# Patient Record
Sex: Female | Born: 1990
Health system: Southern US, Community
[De-identification: ages and names within clinical notes are randomized; demographics above are authoritative.]

## PROBLEM LIST (undated history)

## (undated) DIAGNOSIS — F419 Anxiety disorder, unspecified: Secondary | ICD-10-CM

---

## 2017-09-11 ENCOUNTER — Ambulatory Visit (INDEPENDENT_AMBULATORY_CARE_PROVIDER_SITE_OTHER): Payer: Self-pay

## 2017-09-11 ENCOUNTER — Encounter: Payer: Self-pay | Admitting: General Practice

## 2017-09-11 DIAGNOSIS — Z3201 Encounter for pregnancy test, result positive: Secondary | ICD-10-CM

## 2017-09-11 NOTE — Progress Notes (Signed)
Pt came for UPT-positive, took 2 pregnancy test at home both positive.Based on LMP of 08/20/17, current EDD: 05/27/18, GA: [id number].But pt feels that she's further along than that, states wants care here & needed Letter showing proof of pregnancy.Ad[facility] & also schedule Initial appt. Pt verbally understood.

## 2017-09-11 NOTE — Progress Notes (Signed)
Chart reviewed for nurse visit. Agree with plan of care.   Pincus Largehelps, Jazma Y, DO 09/11/2017 4:48 PM

## 2017-09-12 LAB — POCT PREGNANCY, URINE: Preg Test, Ur: POSITIVE — AB

## 2018-01-14 ENCOUNTER — Encounter (HOSPITAL_COMMUNITY): Payer: Self-pay | Admitting: Emergency Medicine

## 2018-01-14 ENCOUNTER — Observation Stay (HOSPITAL_COMMUNITY)
Admission: EM | Admit: 2018-01-14 | Discharge: 2018-01-14 | Disposition: A | Discharge status: Home / Self Care | Payer: Self-pay | Attending: Cardiovascular Disease | Admitting: Cardiovascular Disease

## 2018-01-14 ENCOUNTER — Other Ambulatory Visit: Payer: Self-pay

## 2018-01-14 DIAGNOSIS — F410 Panic disorder [episodic paroxysmal anxiety] without agoraphobia: Secondary | ICD-10-CM | POA: Insufficient documentation

## 2018-01-14 DIAGNOSIS — R001 Bradycardia, unspecified: Secondary | ICD-10-CM | POA: Insufficient documentation

## 2018-01-14 DIAGNOSIS — R9431 Abnormal electrocardiogram [ECG] [EKG]: Secondary | ICD-10-CM

## 2018-01-14 DIAGNOSIS — R55 Syncope and collapse: Principal | ICD-10-CM | POA: Diagnosis present

## 2018-01-14 DIAGNOSIS — F1290 Cannabis use, unspecified, uncomplicated: Secondary | ICD-10-CM | POA: Insufficient documentation

## 2018-01-14 DIAGNOSIS — F17210 Nicotine dependence, cigarettes, uncomplicated: Secondary | ICD-10-CM | POA: Insufficient documentation

## 2018-01-14 DIAGNOSIS — I4510 Unspecified right bundle-branch block: Secondary | ICD-10-CM | POA: Insufficient documentation

## 2018-01-14 HISTORY — DX: Anxiety disorder, unspecified: F41.9

## 2018-01-14 NOTE — ED Notes (Signed)
Pt leaving AMA, spoke with EDP and decided to leave anyway, risks reviewed, pt verbalized understanding, pt discharged ambulatory with female companion.

## 2018-01-14 NOTE — H&P (Signed)
Cardiology Admission History and Physical:   Patient ID: Sylvia Pennington; MRN: [id number]; DOB: 15-Oct-1990   Admission date: 01/14/2018  Primary Care Provider: Patient, No Pcp Per Primary Cardiologist:  Nahser  Primary Electrophysiologist:  New   Chief Complaint:   Syncope   Patient Profile:   Sylvia Pennington is a 27 y.o. female with a history of recurrent syncope.   History of Present Illness:   Sylvia Pennington is a 27 year old female with a history of recurrent syncope.  These episodes of syncope started for 5 years ago when she would have them once or twice a year.  For the past 2 years they have been occurring much more frequent and now she has them every month or so. He has no memory of happens before or after the event.  She does not know how long she is out.  She denies any chest pain or shortness of breath.  She does not exercise.  She is not all that active.  She does not work.  She has a positive family history of early cardiac death.  She had a cousin who died suddenly at his workplace in his 21s.   Past Medical History:  Diagnosis Date  . Anxiety     History reviewed. No pertinent surgical history.   Medications Prior to Admission: Prior to Admission medications   Not on File     Allergies:   No Known Allergies  Social History:   Social History   Socioeconomic History  . Marital status: Married    Spouse name: Not on file  . Number of children: Not on file  . Years of education: Not on file  . Highest education level: Not on file  Occupational History  . Not on file  Social Needs  . Financial resource strain: Not on file  . Food insecurity:    Worry: Not on file    Inability: Not on file  . Transportation needs:    Medical: Not on file    Non-medical: Not on file  Tobacco Use  . Smoking status: Current Every Day Smoker  Substance and Sexual Activity  . Alcohol use: Not Currently  . Drug use: Yes    Types: Marijuana  . Sexual activity: Not on file    Lifestyle  . Physical activity:    Days per week: Not on file    Minutes per session: Not on file  . Stress: Not on file  Relationships  . Social connections:    Talks on phone: Not on file    Gets together: Not on file    Attends religious service: Not on file    Active member of club or organization: Not on file    Attends meetings of clubs or organizations: Not on file    Relationship status: Not on file  . Intimate partner violence:    Fear of current or ex partner: Not on file    Emotionally abused: Not on file    Physically abused: Not on file    Forced sexual activity: Not on file  Other Topics Concern  . Not on file  Social History Narrative  . Not on file    Family History:   The patient's family history includes Sudden Cardiac Death in her cousin.    ROS:  Please see the history of present illness.  All other ROS reviewed and negative.     Physical Exam/Data:   Vitals:   01/14/18 1403 01/14/18 1411  BP: 96/61   Pulse:  62   Resp: 18   Temp: 97.7 F (36.5 C)   TempSrc: Oral   SpO2: 100%   Weight:  120 lb (54.4 kg)  Height:  5\' 5"  (1.651 m)   No intake or output data in the 24 hours ending 01/14/18 1752 Filed Weights   01/14/18 1411  Weight: 120 lb (54.4 kg)   Body mass index is 19.97 kg/m.  General:   Young black female, no acute distress.  She is very somnolent.  Very thin. HEENT: normal Lymph: no adenopathy Neck: No JVD.  Normal carotids. Endocrine:  No thryomegaly Vascular: No carotid bruits; FA pulses 2+ bilaterally without bruits  Cardiac:  normal S1, S2; RRR; no murmur  Lungs:  clear to auscultation bilaterally, no wheezing, rhonchi or rales  Abd: soft, nontender, no hepatomegaly  Ext: no edema Musculoskeletal:  No deformities, BUE and BLE strength normal and equal Skin: warm and dry  Neuro:  CNs 2-12 intact, no focal abnormalities noted Psych:  Normal affect    EKG: EKG on August 5 reveals sinus bradycardia at 57 beats a minute.  She  has incomplete right bundle branch block concerning for a type II Brugada syndrome.  Relevant CV Studies:   Laboratory Data:  ChemistryNo results for input(s): NA, K, CL, CO2, GLUCOSE, BUN, CREATININE, CALCIUM, GFRNONAA, GFRAA, ANIONGAP in the last 168 hours.  No results for input(s): PROT, ALBUMIN, AST, ALT, ALKPHOS, BILITOT in the last 168 hours. HematologyNo results for input(s): WBC, RBC, HGB, HCT, MCV, MCH, MCHC, RDW, PLT in the last 168 hours. Cardiac EnzymesNo results for input(s): TROPONINI in the last 168 hours. No results for input(s): TROPIPOC in the last 168 hours.  BNPNo results for input(s): BNP, PROBNP in the last 168 hours.  DDimer No results for input(s): DDIMER in the last 168 hours.  Radiology/Studies:  No results found.  Assessment and Plan:   1. Recurrent syncope: The patient has had episodes of syncope for years and they recently seem to be worsening slightly.  Her EKG shows right bundle branch block configuration and possibly a type II Brugada syndrome.  In addition, she has a first cousin who died of sudden cardiac death while at his workplace.  I discussed the case with Dr. Elberta Fortisamnitz of the letter physiology.  We will admit her for observation.  The plan is to have her see electrophysiology tomorrow and possibly do a flecainide challenge.  2.  Anxiety: She has a history of anxiety.  She will need to follow-up with her general medical doctor.  Severity of Illness: The appropriate patient status for this patient is OBSERVATION. Observation status is judged to be reasonable and necessary in order to provide the required intensity of service to ensure the patient's safety. The patient's presenting symptoms, physical exam findings, and initial radiographic and laboratory data in the context of their medical condition is felt to place them at decreased risk for further clinical deterioration. Furthermore, it is anticipated that the patient will be medically stable for  discharge from the hospital within 2 midnights of admission. The following factors support the patient status of observation.   " The patient's presenting symptoms include  Syncope . " The physical exam findings include  None . " The initial radiographic and laboratory data are  Abn ECG c/w Brugada syndrome .     For questions or updates, please contact [facility] Please consult [email or web address] for contact info under Cardiology/STEMI.    Signed, Kristeen MissPhilip Nahser, MD  01/14/2018 5:52 PM

## 2018-01-14 NOTE — ED Notes (Signed)
Took patient vitals patient is resting in the hall way

## 2018-01-14 NOTE — ED Provider Notes (Signed)
[facility] Emergency Department Provider Note MRN:  [id number]  Arrival date & time: 01/14/18     Chief Complaint   Panic Attack   History of Present Illness   Sylvia Pennington is a 27 y.o. year-old female with a history of anxiety and panic attacks presenting to the ED with chief complaint of panic attack.  Patient explains that to 3 hours prior to arrival she was very anxious and stressed out about her life, did not want to give details.  She explains that she passed out briefly quickly regained consciousness.  Denies ever having chest pain prior to or after the event.  No recent fevers or cough.  States this is similar to prior panic attacks.  Currently asymptomatic.  Denies SI, HI, AVH.  Some occasional alcohol and marijuana use.  Review of Systems  A complete 10 system review of systems was obtained and all systems are negative except as noted in the HPI and PMH.   Patient's Health History    Past Medical History:  Diagnosis Date  . Anxiety     History reviewed. No pertinent surgical history.  Family History  Problem Relation Age of Onset  . Sudden Cardiac Death Cousin     Social History   Socioeconomic History  . Marital status: Married    Spouse name: Not on file  . Number of children: Not on file  . Years of education: Not on file  . Highest education level: Not on file  Occupational History  . Not on file  Social Needs  . Financial resource strain: Not on file  . Food insecurity:    Worry: Not on file    Inability: Not on file  . Transportation needs:    Medical: Not on file    Non-medical: Not on file  Tobacco Use  . Smoking status: Current Every Day Smoker  Substance and Sexual Activity  . Alcohol use: Not Currently  . Drug use: Yes    Types: Marijuana  . Sexual activity: Not on file  Lifestyle  . Physical activity:    Days per week: Not on file    Minutes per session: Not on file  . Stress: Not on file  Relationships  . Social  connections:    Talks on phone: Not on file    Gets together: Not on file    Attends religious service: Not on file    Active member of club or organization: Not on file    Attends meetings of clubs or organizations: Not on file    Relationship status: Not on file  . Intimate partner violence:    Fear of current or ex partner: Not on file    Emotionally abused: Not on file    Physically abused: Not on file    Forced sexual activity: Not on file  Other Topics Concern  . Not on file  Social History Narrative  . Not on file     Physical Exam  Vital Signs and Nursing Notes reviewed Vitals:   01/14/18 1403  BP: 96/61  Pulse: 62  Resp: 18  Temp: 97.7 F (36.5 C)  SpO2: 100%    CONSTITUTIONAL: Well-appearing, NAD NEURO:  Alert and oriented x 3, no focal deficits EYES:  eyes equal and reactive ENT/NECK:  no LAD, no JVD CARDIO: Regular rate, well-perfused, normal S1 and S2 PULM:  CTAB no wheezing or rhonchi GI/GU:  normal bowel sounds, non-distended, non-tender MSK/SPINE:  No gross deformities, no edema SKIN:  no rash, atraumatic PSYCH:  Appropriate speech and behavior  Diagnostic and Interventional Summary    EKG Interpretation  Date/Time:    Ventricular Rate:    PR Interval:    QRS Duration:   QT Interval:    QTC Calculation:   R Axis:     Text Interpretation:        Labs Reviewed - No data to display  No orders to display    Medications - No data to display   Procedures Critical Care  ED Course and Medical Decision Making  I have reviewed the triage vital signs and the nursing notes.  Pertinent labs & imaging results that were available during my care of the patient were reviewed by me and considered in my medical decision making (see below for details). Clinical Course as of Jan 14 2234  Mon Jan 14, 2018  6575 27 year old female presenting with symptoms consistent with her prior panic attacks.  Denying any chest pain, vital signs stable, currently back  to baseline, sleeping comfortably, symptoms of anxiety resolved.  No specific triggers of stress recently, not a harm to herself or others.  Will obtain screening EKG and discharge.   [MB]  1639 Screening EKG with incomplete right bundle branch block, will consult cardiology to discuss possibility of Brugada.   [MB]  1802 EKG discussed with cardiologist as well as electrophysiologist, patient will be admitted for further evaluation.   [MB]    Clinical Course User Index [MB] Sabas Sous, MD    While waiting for an inpatient bed, patient became impatient with the weight, requesting to leave AGAINST MEDICAL ADVICE.  Had a long discussion with the patient, also with family member present, about the risks of leaving with a possible abnormality on her EKG and heart.  Discussed the possibility of recurrent symptoms of syncope, arrhythmia, death.  Patient expresses understanding of these risks, still wishing to leave.  States that she will come back tomorrow.  Family unable to persuade her to stay.  Advised patient to return to the emergency department at any time for continued care.  Elmer Sow. Pilar Plate, MD [facility] Emergency Medicine [facility] [email or web address]  Final Clinical Impressions(s) / ED Diagnoses     ICD-10-CM   1. Abnormal EKG R94.31   2. Syncope, unspecified syncope type R55     ED Discharge Orders    None         Sabas Sous, MD 01/14/18 2235

## 2018-01-14 NOTE — ED Triage Notes (Signed)
Arrived via EMS from home patient felt like she could stand up. EMS reported patient feels overwhelmed and recent history of anxiety. States used a small amount of mariajuana today. Alert answering and following commands appropriate.

## 2018-01-14 NOTE — ED Notes (Signed)
Pt wishing to leave AMA, MD aware.

## 2019-06-13 NOTE — L&D Delivery Note (Addendum)
Delivery Note Sylvia Pennington is a 29 y.o. G2P0010 at [redacted]w[redacted]d admitted for SOL.   GBS Status:  Positive/-- (11/13 0000) Maximum Maternal Temperature: 98.8  Labor course: Initial SVE: 7. Augmentation with: Pitocin. She then progressed to complete.  ROM: 12h 82m with light meconium stained fluid  Birth: At 1551 a viable female was delivered via spontaneous vaginal delivery (Presentation: cephalic; LOA). Nuchal cord present: No.  Cord around body. There was difficulty with delivery of the shoulders in which a shoulder dystocia was called. Dr. Vergie Living was called by RN staff to attend delivery. McRoberts with suprapubic pressure, followed by woodscrew was attempted. After 30 seconds, the posterior shoulder was delivered without difficulty. Cord clamped x2 and cut by CNM after 30 seconds and taken to warmer where NICU was present to care for infant. Cord blood collected. The placenta separated spontaneously and delivered via gentle cord traction.  Pitocin infused rapidly IV per protocol. Brisk bleeding continued and TXA and Cytotec were given. Foley catheter was placed to empty bladder. Fundus firm with massage.  Placenta inspected and appears to be intact with a 3 VC.   Sponge and instrument count were correct x2.  Intrapartum complications: pre-eclampsia, shoulder dystocia Anesthesia:  local and epidural Episiotomy: none Lacerations:  1st degree labial- hemostatic and not repaired, 2nd degree vaginal- repaired in usual fashion Suture Repair: 3.0 vicryl EBL (mL): 1016   Infant: APGAR (1 MIN): 5   APGAR (5 MINS): 7   APGAR (10 MINS): 9    Infant weight: pending  Mom to [facility].  Baby to Couplet care / Skin to Skin. Placenta to Pathology for MSAF   Plans to Bottlefeed Contraception: none Circumcision: declines  Note sent to [facility]: [facility] for pp visit.  Brand Males CNM 04/24/2020 4:41 PM

## 2019-12-04 DIAGNOSIS — H1013 Acute atopic conjunctivitis, bilateral: Secondary | ICD-10-CM | POA: Diagnosis not present

## 2019-12-09 ENCOUNTER — Other Ambulatory Visit: Payer: Self-pay

## 2019-12-09 ENCOUNTER — Inpatient Hospital Stay (HOSPITAL_COMMUNITY)
Admission: AD | Admit: 2019-12-09 | Discharge: 2019-12-09 | Disposition: A | Discharge status: Home / Self Care | Payer: Medicaid Other | Attending: Obstetrics and Gynecology | Admitting: Obstetrics and Gynecology

## 2019-12-09 ENCOUNTER — Encounter (HOSPITAL_COMMUNITY): Payer: Self-pay | Admitting: Obstetrics and Gynecology

## 2019-12-09 DIAGNOSIS — R109 Unspecified abdominal pain: Secondary | ICD-10-CM | POA: Insufficient documentation

## 2019-12-09 DIAGNOSIS — O26892 Other specified pregnancy related conditions, second trimester: Secondary | ICD-10-CM | POA: Insufficient documentation

## 2019-12-09 DIAGNOSIS — O99612 Diseases of the digestive system complicating pregnancy, second trimester: Secondary | ICD-10-CM | POA: Diagnosis not present

## 2019-12-09 DIAGNOSIS — Z3A21 21 weeks gestation of pregnancy: Secondary | ICD-10-CM | POA: Insufficient documentation

## 2019-12-09 DIAGNOSIS — F17210 Nicotine dependence, cigarettes, uncomplicated: Secondary | ICD-10-CM | POA: Insufficient documentation

## 2019-12-09 DIAGNOSIS — R1031 Right lower quadrant pain: Secondary | ICD-10-CM | POA: Diagnosis not present

## 2019-12-09 DIAGNOSIS — O99332 Smoking (tobacco) complicating pregnancy, second trimester: Secondary | ICD-10-CM | POA: Insufficient documentation

## 2019-12-09 DIAGNOSIS — R102 Pelvic and perineal pain: Secondary | ICD-10-CM | POA: Diagnosis present

## 2019-12-09 DIAGNOSIS — K5900 Constipation, unspecified: Secondary | ICD-10-CM | POA: Diagnosis not present

## 2019-12-09 DIAGNOSIS — O0932 Supervision of pregnancy with insufficient antenatal care, second trimester: Secondary | ICD-10-CM

## 2019-12-09 LAB — URINALYSIS, ROUTINE W REFLEX MICROSCOPIC
Bilirubin Urine: NEGATIVE
Glucose, UA: NEGATIVE mg/dL
Ketones, ur: NEGATIVE mg/dL
Leukocytes,Ua: NEGATIVE
Nitrite: NEGATIVE
Protein, ur: NEGATIVE mg/dL
Specific Gravity, Urine: 1.019 (ref 1.005–1.030)
pH: 5 (ref 5.0–8.0)

## 2019-12-09 LAB — ABO/RH: ABO/RH(D): A POS

## 2019-12-09 LAB — POCT PREGNANCY, URINE: Preg Test, Ur: POSITIVE — AB

## 2019-12-09 NOTE — Discharge Instructions (Signed)

## 2019-12-09 NOTE — MAU Note (Signed)
Presents for RLQ pain, that occurred this morning, but not currently.  Denies VB.  Reports LMP was first week of January.  States needs proof of pregnancy and a ultrasound.  Hasn't initiated [facility].

## 2019-12-09 NOTE — MAU Provider Note (Signed)
History     CSN: [id number]  Arrival date and time: 12/09/19 1346   First Provider Initiated Contact with Patient 12/09/19 1448      Chief Complaint  Patient presents with  . Pelvic Pain   HPI  [name] is a 29 y.o. G2P0010 at [redacted]w[redacted]d who presents to MAU today with complaint of pain this morning in the RLQ. She denies pain now. She did not take any pain medications. She denies vaginal bleeding, discharge, UTI symptoms, N/V/D today. She has had mild constipation, last BM was yesterday. She states LMP was in January.   OB History    Gravida  2   Para      Term      Preterm      AB  1   Living        SAB  1   TAB      Ectopic      Multiple      Live Births              Past Medical History:  Diagnosis Date  . Anxiety     History reviewed. No pertinent surgical history.  Family History  Problem Relation Age of Onset  . Sudden Cardiac Death Cousin     Social History   Tobacco Use  . Smoking status: Current Every Day Smoker    Packs/day: 0.25    Years: 0.00    Pack years: 0.00  . Smokeless tobacco: Never Used  . Tobacco comment: 1-2 cig/day  Substance Use Topics  . Alcohol use: Not Currently  . Drug use: Not Currently    Types: Marijuana    Comment: states it has been a month    Allergies: No Known Allergies  No medications prior to admission.    Review of Systems  Constitutional: Negative for fever.  Gastrointestinal: Positive for abdominal pain and constipation. Negative for diarrhea, nausea and vomiting.  Genitourinary: Negative for dysuria, frequency, urgency, vaginal bleeding and vaginal discharge.   Physical Exam   Blood pressure 117/71, pulse 81, temperature 98.2 F (36.8 C), temperature source Oral, resp. rate 20, height 5\' 5"  (1.651 m), weight 68.2 kg, last menstrual period 07/12/2019, SpO2 99 %.  Physical Exam Vitals and nursing note reviewed.  Constitutional:      General: She is not in acute distress.     Appearance: She is well-developed.  HENT:     Head: Normocephalic and atraumatic.  Cardiovascular:     Rate and Rhythm: Normal rate.  Pulmonary:     Effort: Pulmonary effort is normal.  Abdominal:     General: There is no distension.     Palpations: Abdomen is soft. There is no mass.     Tenderness: There is no abdominal tenderness. There is no guarding or rebound.     Comments: FH 22 cm  Skin:    General: Skin is warm and dry.     Findings: No erythema.  Neurological:     Mental Status: She is alert and oriented to person, place, and time.  Psychiatric:        Mood and Affect: Mood normal.   Cervix: closed, thick, firm, posterior  Results for orders placed or performed during the hospital encounter of 12/09/19 (from the past 24 hour(s))  Pregnancy, urine POC     Status: Abnormal   Collection Time: 12/09/19  2:25 PM  Result Value Ref Range   Preg Test, Ur POSITIVE (A) NEGATIVE  Urinalysis, Routine w  reflex microscopic     Status: Abnormal   Collection Time: 12/09/19  2:29 PM  Result Value Ref Range   Color, Urine YELLOW YELLOW   APPearance HAZY (A) CLEAR   Specific Gravity, Urine 1.019 1.005 - 1.030   pH 5.0 5.0 - 8.0   Glucose, UA NEGATIVE NEGATIVE mg/dL   Hgb urine dipstick SMALL (A) NEGATIVE   Bilirubin Urine NEGATIVE NEGATIVE   Ketones, ur NEGATIVE NEGATIVE mg/dL   Protein, ur NEGATIVE NEGATIVE mg/dL   Nitrite NEGATIVE NEGATIVE   Leukocytes,Ua NEGATIVE NEGATIVE   RBC / HPF 6-10 0 - 5 RBC/hpf   WBC, UA 0-5 0 - 5 WBC/hpf   Bacteria, UA RARE (A) NONE SEEN   Squamous Epithelial / LPF 6-10 0 - 5   Mucus PRESENT   ABO/Rh     Status: None   Collection Time: 12/09/19  3:05 PM  Result Value Ref Range   ABO/RH(D) A POS    No rh immune globuloin      NOT A RH IMMUNE GLOBULIN CANDIDATE, PT RH POSITIVE Performed at [facility], [address] [place], [place] [address]     MAU Course  Procedures None  MDM +UPT FHR - 138 bpm ABO/Rh today  Cervix  closed  Assessment and Plan  A: SIUP around 20-22 weeks  Intermittent abdominal pain  Constipation, mild   P: Discharge home Tylenol PRN for pain advised Consider Colace and/or Mirilax for intermittent constipation  Second trimester precautions discussed Outpatient MFM [place] for Anatomy. Message sent to MFM to schedule.  Patient advised to follow-up with [facility] to start prenatal care as planned  Patient may return to MAU as needed or if her condition were to change or worsen  Vonzella Nipple, PA-C 12/09/2019, 3:26 PM

## 2019-12-10 LAB — CULTURE, OB URINE: Culture: NO GROWTH

## 2019-12-13 DIAGNOSIS — H5213 Myopia, bilateral: Secondary | ICD-10-CM | POA: Diagnosis not present

## 2019-12-22 ENCOUNTER — Ambulatory Visit (INDEPENDENT_AMBULATORY_CARE_PROVIDER_SITE_OTHER): Payer: Medicaid Other | Admitting: *Deleted

## 2019-12-22 ENCOUNTER — Other Ambulatory Visit: Payer: Self-pay

## 2019-12-22 VITALS — BP 112/65 | HR 94 | Temp 97.9°F | Wt 150.4 lb

## 2019-12-22 DIAGNOSIS — Z3482 Encounter for supervision of other normal pregnancy, second trimester: Secondary | ICD-10-CM

## 2019-12-22 DIAGNOSIS — Z3A23 23 weeks gestation of pregnancy: Secondary | ICD-10-CM

## 2019-12-22 DIAGNOSIS — Z3480 Encounter for supervision of other normal pregnancy, unspecified trimester: Secondary | ICD-10-CM | POA: Diagnosis not present

## 2019-12-22 DIAGNOSIS — O0930 Supervision of pregnancy with insufficient antenatal care, unspecified trimester: Secondary | ICD-10-CM | POA: Insufficient documentation

## 2019-12-22 MED ORDER — BLOOD PRESSURE MONITOR AUTOMAT DEVI: 1.0000 | Freq: Every day | 0 refills | Status: AC

## 2019-12-22 MED ORDER — VITAFOL GUMMIES 3.33-0.333-34.8 MG PO CHEW: 3.0000 | CHEWABLE_TABLET | Freq: Every day | ORAL | 12 refills | Status: AC

## 2019-12-22 MED ORDER — GOJJI WEIGHT SCALE MISC: 1.0000 | Freq: Every day | 0 refills | Status: AC | PRN

## 2019-12-22 NOTE — Progress Notes (Signed)
   PRENATAL INTAKE SUMMARY  Ms. Epling presents today New OB Nurse Interview.  OB History    Gravida  2   Para      Term      Preterm      AB  1   Living        SAB  1   TAB      Ectopic      Multiple      Live Births             I have reviewed the patient's medical, obstetrical, social, and family histories, medications, and available lab results.  SUBJECTIVE She has no unusual complaints  OBJECTIVE Initial nurse interview for history and labs (New OB). LATE TO CARE  EDD: 04/17/20 by LMP?? GA: [redacted]w[redacted]d G2P0010 FHT: 150  GENERAL APPEARANCE: alert, well appearing, in no apparent distress, oriented to person, place and time, well hydrated   ASSESSMENT Normal pregnancy  PLAN Prenatal care-[facility] Pnl/HIV  OB Urine Culture GC/CT/PAP at next visit with Judeth Horn, NP 01/07/2020 HgbEval/SMA/CF ([facility]) will think about testing and will inform nurse at next visit [facility] will think about testing Continue PNV Patient to sign up for [facility] Rx for BP monitor and weight scale sent to [facility] A1C Glucose  AFP Patient to keep ultrasound tomorrow 12/23/19  Clovis Pu, RN

## 2019-12-22 NOTE — Patient Instructions (Addendum)
Second Trimester of Pregnancy  The second trimester is from week 14 through week 27 (month 4 through 6). This is often the time in pregnancy that you feel your best. Often times, morning sickness has lessened or quit. You may have more energy, and you may get hungry more often. Your unborn baby is growing rapidly. At the end of the sixth month, he or she is about 9 inches long and weighs about 1 pounds. You will likely feel the baby move between 18 and 20 weeks of pregnancy. Follow these instructions at home: Medicines  Take over-the-counter and prescription medicines only as told by your doctor. Some medicines are safe and some medicines are not safe during pregnancy.  Take a prenatal vitamin that contains at least 600 micrograms (mcg) of folic acid.  If you have trouble pooping (constipation), take medicine that will make your stool soft (stool softener) if your doctor approves. Eating and drinking   Eat regular, healthy meals.  Avoid raw meat and uncooked cheese.  If you get low calcium from the food you eat, talk to your doctor about taking a daily calcium supplement.  Avoid foods that are high in fat and sugars, such as fried and sweet foods.  If you feel sick to your stomach (nauseous) or throw up (vomit): ? Eat 4 or 5 small meals a day instead of 3 large meals. ? Try eating a few soda crackers. ? Drink liquids between meals instead of during meals.  To prevent constipation: ? Eat foods that are high in fiber, like fresh fruits and vegetables, whole grains, and beans. ? Drink enough fluids to keep your pee (urine) clear or pale yellow. Activity  Exercise only as told by your doctor. Stop exercising if you start to have cramps.  Do not exercise if it is too hot, too humid, or if you are in a place of great height (high altitude).  Avoid heavy lifting.  Wear low-heeled shoes. Sit and stand up straight.  You can continue to have sex unless your doctor tells you not  to. Relieving pain and discomfort  Wear a good support bra if your breasts are tender.  Take warm water baths (sitz baths) to soothe pain or discomfort caused by hemorrhoids. Use hemorrhoid cream if your doctor approves.  Rest with your legs raised if you have leg cramps or low back pain.  If you develop puffy, bulging veins (varicose veins) in your legs: ? Wear support hose or compression stockings as told by your doctor. ? Raise (elevate) your feet for 15 minutes, 3-4 times a day. ? Limit salt in your food. Prenatal care  Write down your questions. Take them to your prenatal visits.  Keep all your prenatal visits as told by your doctor. This is important. Safety  Wear your seat belt when driving.  Make a list of emergency phone numbers, including numbers for family, friends, the hospital, and police and fire departments. General instructions  Ask your doctor about the right foods to eat or for help finding a counselor, if you need these services.  Ask your doctor about local prenatal classes. Begin classes before month 6 of your pregnancy.  Do not use hot tubs, steam rooms, or saunas.  Do not douche or use tampons or scented sanitary pads.  Do not cross your legs for long periods of time.  Visit your dentist if you have not done so. Use a soft toothbrush to brush your teeth. Floss gently.  Avoid all  smoking, herbs, and alcohol. Avoid drugs that are not approved by your doctor.  Do not use any products that contain nicotine or tobacco, such as cigarettes and e-cigarettes. If you need help quitting, ask your doctor.  Avoid cat litter boxes and soil used by cats. These carry germs that can cause birth defects in the baby and can cause a loss of your baby (miscarriage) or stillbirth. Contact a doctor if:  You have mild cramps or pressure in your lower belly.  You have pain when you pee (urinate).  You have bad smelling fluid coming from your vagina.  You continue to  feel sick to your stomach (nauseous), throw up (vomit), or have watery poop (diarrhea).  You have a nagging pain in your belly area.  You feel dizzy. Get help right away if:  You have a fever.  You are leaking fluid from your vagina.  You have spotting or bleeding from your vagina.  You have severe belly cramping or pain.  You lose or gain weight rapidly.  You have trouble catching your breath and have chest pain.  You notice sudden or extreme puffiness (swelling) of your face, hands, ankles, feet, or legs.  You have not felt the baby move in over an hour.  You have severe headaches that do not go away when you take medicine.  You have trouble seeing. Summary  The second trimester is from week 14 through week 27 (months 4 through 6). This is often the time in pregnancy that you feel your best.  To take care of yourself and your unborn baby, you will need to eat healthy meals, take medicines only if your doctor tells you to do so, and do activities that are safe for you and your baby.  Call your doctor if you get sick or if you notice anything unusual about your pregnancy. Also, call your doctor if you need help with the right food to eat, or if you want to know what activities are safe for you. This information is not intended to replace advice given to you by your health care provider. Make sure you discuss any questions you have with your health care provider. Document Revised: 09/20/2018 Document Reviewed: 07/04/2016 Elsevier Patient Education  2020 [employer]  Genetic Testing During Pregnancy Genetic testing during pregnancy is also called prenatal genetic testing. This type of testing can determine if your baby is at risk of being born with a disorder caused by abnormal genes or chromosomes (genetic disorder). Chromosomes contain genes that control how your baby will develop in your womb. There are many different genetic disorders. Examples of genetic disorders that may  be found through genetic testing include Down syndrome and cystic fibrosis. Gene changes (mutations) can be passed down through families. Genetic testing is offered to all women before or during pregnancy. You can choose whether to have genetic testing. Why is genetic testing done? Genetic testing is done during pregnancy to find out whether your child is at risk for a genetic disorder. Having genetic testing allows you to:  Discuss your test results and options with a genetic counselor.  Prepare for a baby that may be born with a genetic disorder. Learning about the disorder ahead of time helps you be better prepared to manage it. Your health care providers can also be prepared in case your baby requires special care before or after birth.  Consider whether you want to continue with the pregnancy. In some cases, genetic testing may be done to  learn about the traits a child will inherit. Types of genetic tests There are two basic types of genetic testing. Screening tests indicate whether your developing baby (fetus) is at higher risk for a genetic disorder. Diagnostic tests check actual fetal cells to diagnose a genetic disorder. Screening tests     Screening tests will not harm your baby. They are recommended for all pregnant women. Types of screening tests include:  Carrier screening. This test involves checking genes from both parents by testing their blood or saliva. The test checks to find out if the parents carry a genetic mutation that may be passed to a baby. In most cases, both parents must carry the mutation for a baby to be at risk.  First trimester screening. This test combines a blood test with sound wave imaging of your baby (fetal ultrasound). This screening test checks for a risk of Down syndrome or other defects caused by having extra chromosomes. It also checks for defects of the heart, abdomen, or skeleton.  Second trimester screening also combines a blood test with a fetal  ultrasound exam. It checks for a risk of genetic defects of the face, brain, spine, heart, or limbs.  Combined or sequential screening. This type of testing combines the results of first and second trimester screening. This type of testing may be more accurate than first or second trimester screening alone.  Cell-free DNA testing. This is a blood test that detects cells released by the placenta that get into the mother's blood. It can be used to check for a risk of Down syndrome, other extra chromosome syndromes, and disorders caused by abnormal numbers of sex chromosomes. This test can be done any time after 10 weeks of pregnancy.  Diagnostic tests Diagnostic tests carry slight risks of problems, including bleeding, infection, and loss of the pregnancy. These tests are done only if your baby is at risk for a genetic disorder. You may meet with a genetic counselor to discuss the risks and benefits before having diagnostic tests. Examples of diagnostic tests include:  Chorionic villus sampling (CVS). This involves a procedure to remove and test a sample of cells taken from the placenta. The procedure may be done between 10 and 12 weeks of pregnancy.  Amniocentesis. This involves a procedure to remove and test a sample of fluid (amniotic fluid) and cells from the sac that surrounds the developing baby. The procedure may be done between 15 and 20 weeks of pregnancy. What do the results mean? For a screening test:  If the results are negative, it often means that your child is not at higher risk. There is still a slight chance your child could have a genetic disorder.  If the results are positive, it does not mean your child will have a genetic disorder. It may mean that your child has a higher-than-normal risk for a genetic disorder. In that case, you may want to talk with a genetic counselor about whether you should have diagnostic genetic tests. For a diagnostic test:  If the result is negative,  it is unlikely that your child will have a genetic disorder.  If the test is positive for a genetic disorder, it is likely that your child will have the disorder. The test may not tell how severe the disorder will be. Talk with your health care provider about your options. Questions to ask your health care provider Before talking to your health care provider about genetic testing, find out if there is a history of genetic  disorders in your family. It may also help to know your family's ethnic origins. Then ask your health care provider the following questions:  Is my baby at risk for a genetic disorder?  What are the benefits of having genetic screening?  What tests are best for me and my baby?  What are the risks of each test?  If I get a positive result on a screening test, what is the next step?  Should I meet with a genetic counselor before having a diagnostic test?  Should my partner or other members of my family be tested?  How much do the tests cost? Will my insurance cover the testing? Summary  Genetic testing is done during pregnancy to find out whether your child is at risk for a genetic disorder.  Genetic testing is offered to all women before or during pregnancy. You can choose whether to have genetic testing.  There are two basic types of genetic testing. Screening tests indicate whether your developing baby (fetus) is at higher risk for a genetic disorder. Diagnostic tests check actual fetal cells to diagnose a genetic disorder.  If a diagnostic genetic test is positive, talk with your health care provider about your options. This information is not intended to replace advice given to you by your health care provider. Make sure you discuss any questions you have with your health care provider. Document Revised: 09/19/2018 Document Reviewed: 08/13/2017 Elsevier Patient Education  2020 [employer].  How to Take Your Blood Pressure You can take your blood pressure at  home with a machine. You may need to check your blood pressure at home:  To check if you have high blood pressure (hypertension).  To check your blood pressure over time.  To make sure your blood pressure medicine is working. Supplies needed: You will need a blood pressure machine, or monitor. You can buy one at a drugstore or online. When choosing one:  Choose one with an arm cuff.  Choose one that wraps around your upper arm. Only one finger should fit between your arm and the cuff.  Do not choose one that measures your blood pressure from your wrist or finger. Your doctor can suggest a monitor. How to prepare Avoid these things for 30 minutes before checking your blood pressure:  Drinking caffeine.  Drinking alcohol.  Eating.  Smoking.  Exercising. Five minutes before checking your blood pressure:  Pee.  Sit in a dining chair. Avoid sitting in a soft couch or armchair.  Be quiet. Do not talk. How to take your blood pressure Follow the instructions that came with your machine. If you have a digital blood pressure monitor, these may be the instructions: 1. Sit up straight. 2. Place your feet on the floor. Do not cross your ankles or legs. 3. Rest your left arm at the level of your heart. You may rest it on a table, desk, or chair. 4. Pull up your shirt sleeve. 5. Wrap the blood pressure cuff around the upper part of your left arm. The cuff should be 1 inch (2.5 cm) above your elbow. It is best to wrap the cuff around bare skin. 6. Fit the cuff snugly around your arm. You should be able to place only one finger between the cuff and your arm. 7. Put the cord inside the groove of your elbow. 8. Press the power button. 9. Sit quietly while the cuff fills with air and loses air. 10. Write down the numbers on the screen. 11. Wait  2-3 minutes and then repeat steps 1-10. What do the numbers mean? Two numbers make up your blood pressure. The first number is called systolic  pressure. The second is called diastolic pressure. An example of a blood pressure reading is "120 over 80" (or 120/80). If you are an adult and do not have a medical condition, use this guide to find out if your blood pressure is normal: Normal  First number: below 120.  Second number: below 80. Elevated  First number: 120-129.  Second number: below 80. Hypertension stage 1  First number: 130-139.  Second number: 80-89. Hypertension stage 2  First number: 140 or above.  Second number: 90 or above. Your blood pressure is above normal even if only the top or bottom number is above normal. Follow these instructions at home:  Check your blood pressure as often as your doctor tells you to.  Take your monitor to your next doctor's appointment. Your doctor will: ? Make sure you are using it correctly. ? Make sure it is working right.  Make sure you understand what your blood pressure numbers should be.  Tell your doctor if your medicines are causing side effects. Contact a doctor if:  Your blood pressure keeps being high. Get help right away if:  Your first blood pressure number is higher than 180.  Your second blood pressure number is higher than 120. This information is not intended to replace advice given to you by your health care provider. Make sure you discuss any questions you have with your health care provider. Document Revised: 05/11/2017 Document Reviewed: 11/05/2015 Elsevier Patient Education  2020 [employer].

## 2019-12-23 ENCOUNTER — Ambulatory Visit: Payer: Medicaid Other | Attending: Obstetrics and Gynecology

## 2019-12-23 ENCOUNTER — Other Ambulatory Visit: Payer: Self-pay | Admitting: *Deleted

## 2019-12-23 ENCOUNTER — Ambulatory Visit: Payer: Medicaid Other | Admitting: *Deleted

## 2019-12-23 DIAGNOSIS — O0932 Supervision of pregnancy with insufficient antenatal care, second trimester: Secondary | ICD-10-CM | POA: Diagnosis not present

## 2019-12-23 DIAGNOSIS — Z363 Encounter for antenatal screening for malformations: Secondary | ICD-10-CM

## 2019-12-23 DIAGNOSIS — O0930 Supervision of pregnancy with insufficient antenatal care, unspecified trimester: Secondary | ICD-10-CM | POA: Diagnosis not present

## 2019-12-23 DIAGNOSIS — Z3A20 20 weeks gestation of pregnancy: Secondary | ICD-10-CM

## 2019-12-23 DIAGNOSIS — Z362 Encounter for other antenatal screening follow-up: Secondary | ICD-10-CM

## 2019-12-23 DIAGNOSIS — Z3480 Encounter for supervision of other normal pregnancy, unspecified trimester: Secondary | ICD-10-CM

## 2019-12-23 DIAGNOSIS — Z3687 Encounter for antenatal screening for uncertain dates: Secondary | ICD-10-CM

## 2019-12-23 LAB — CBC/D/PLT+RPR+RH+ABO+RUB AB...
Antibody Screen: NEGATIVE
Basophils Absolute: 0.1 10*3/uL (ref 0.0–0.2)
Basos: 1 %
EOS (ABSOLUTE): 0.1 10*3/uL (ref 0.0–0.4)
Eos: 1 %
HCV Ab: <0.1 s/co ratio (ref 0.0–0.9)
HIV Screen 4th Generation wRfx: NONREACTIVE
Hematocrit: 27.9 % — ABNORMAL LOW (ref 34.0–46.6)
Hemoglobin: 9.7 g/dL — ABNORMAL LOW (ref 11.1–15.9)
Hepatitis B Surface Ag: NEGATIVE
Immature Grans (Abs): 0 10*3/uL (ref 0.0–0.1)
Immature Granulocytes: 1 %
Lymphocytes Absolute: 1.7 10*3/uL (ref 0.7–3.1)
Lymphs: 22 %
MCH: 32.7 pg (ref 26.6–33.0)
MCHC: 34.8 g/dL (ref 31.5–35.7)
MCV: 94 fL (ref 79–97)
Monocytes Absolute: 0.8 10*3/uL (ref 0.1–0.9)
Monocytes: 10 %
Neutrophils Absolute: 5.3 10*3/uL (ref 1.4–7.0)
Neutrophils: 65 %
Platelets: 230 10*3/uL (ref 150–450)
RBC: 2.97 x10E6/uL — ABNORMAL LOW (ref 3.77–5.28)
RDW: 13.3 % (ref 11.7–15.4)
RPR Ser Ql: NONREACTIVE
Rh Factor: POSITIVE
Rubella Antibodies, IGG: <0.9 index — ABNORMAL LOW (ref >0.99)
WBC: 8 10*3/uL (ref 3.4–10.8)

## 2019-12-23 LAB — OB RESULTS CONSOLE RUBELLA ANTIBODY, IGM: Rubella: NON-IMMUNE/NOT IMMUNE

## 2019-12-23 LAB — HCV INTERPRETATION

## 2019-12-24 LAB — GLUCOSE, RANDOM: Glucose: 83 mg/dL (ref 65–99)

## 2019-12-24 LAB — AFP, SERUM, OPEN SPINA BIFIDA
AFP MoM: 1.15
AFP Value: 109.2 ng/mL
Gest. Age on Collection Date: 23 weeks
Maternal Age At EDD: 29.7 yr
OSBR Risk 1 IN: 10000
Test Results:: NEGATIVE
Weight: 150 [lb_av]

## 2019-12-24 LAB — HEMOGLOBIN A1C
Est. average glucose Bld gHb Est-mCnc: 111 mg/dL
Hgb A1c MFr Bld: 5.5 % (ref 4.8–5.6)

## 2019-12-27 ENCOUNTER — Encounter: Payer: Self-pay | Admitting: Student

## 2019-12-27 DIAGNOSIS — R8271 Bacteriuria: Secondary | ICD-10-CM | POA: Insufficient documentation

## 2019-12-27 DIAGNOSIS — Z2839 Other underimmunization status: Secondary | ICD-10-CM | POA: Insufficient documentation

## 2019-12-27 LAB — URINE CULTURE, OB REFLEX

## 2019-12-27 LAB — CULTURE, OB URINE

## 2020-01-01 ENCOUNTER — Ambulatory Visit (INDEPENDENT_AMBULATORY_CARE_PROVIDER_SITE_OTHER): Payer: Medicaid Other | Admitting: Family Medicine

## 2020-01-01 ENCOUNTER — Encounter: Payer: Self-pay | Admitting: Family Medicine

## 2020-01-01 ENCOUNTER — Other Ambulatory Visit: Payer: Self-pay

## 2020-01-01 ENCOUNTER — Other Ambulatory Visit (HOSPITAL_COMMUNITY)
Admission: RE | Admit: 2020-01-01 | Discharge: 2020-01-01 | Disposition: A | Discharge status: Home / Self Care | Payer: Medicaid Other | Source: Ambulatory Visit | Attending: Family Medicine | Admitting: Family Medicine

## 2020-01-01 VITALS — BP 120/74 | HR 92 | Wt 153.0 lb

## 2020-01-01 DIAGNOSIS — Z283 Underimmunization status: Secondary | ICD-10-CM | POA: Diagnosis not present

## 2020-01-01 DIAGNOSIS — Z3480 Encounter for supervision of other normal pregnancy, unspecified trimester: Secondary | ICD-10-CM | POA: Insufficient documentation

## 2020-01-01 DIAGNOSIS — Z3A22 22 weeks gestation of pregnancy: Secondary | ICD-10-CM | POA: Diagnosis not present

## 2020-01-01 DIAGNOSIS — O99412 Diseases of the circulatory system complicating pregnancy, second trimester: Secondary | ICD-10-CM

## 2020-01-01 DIAGNOSIS — O0992 Supervision of high risk pregnancy, unspecified, second trimester: Secondary | ICD-10-CM | POA: Diagnosis not present

## 2020-01-01 DIAGNOSIS — I499 Cardiac arrhythmia, unspecified: Secondary | ICD-10-CM | POA: Diagnosis not present

## 2020-01-01 DIAGNOSIS — R55 Syncope and collapse: Secondary | ICD-10-CM

## 2020-01-01 DIAGNOSIS — O99820 Streptococcus B carrier state complicating pregnancy: Secondary | ICD-10-CM | POA: Diagnosis not present

## 2020-01-01 DIAGNOSIS — O99891 Other specified diseases and conditions complicating pregnancy: Secondary | ICD-10-CM

## 2020-01-01 DIAGNOSIS — O0930 Supervision of pregnancy with insufficient antenatal care, unspecified trimester: Secondary | ICD-10-CM

## 2020-01-01 DIAGNOSIS — R8271 Bacteriuria: Secondary | ICD-10-CM

## 2020-01-01 DIAGNOSIS — Z3482 Encounter for supervision of other normal pregnancy, second trimester: Secondary | ICD-10-CM

## 2020-01-01 DIAGNOSIS — O0932 Supervision of pregnancy with insufficient antenatal care, second trimester: Secondary | ICD-10-CM

## 2020-01-01 DIAGNOSIS — Z2839 Other underimmunization status: Secondary | ICD-10-CM

## 2020-01-01 LAB — POCT URINALYSIS DIP (DEVICE)
Glucose, UA: 100 mg/dL — AB
Leukocytes,Ua: NEGATIVE
Nitrite: NEGATIVE
Protein, ur: 30 mg/dL — AB
Specific Gravity, Urine: 1.025 (ref 1.005–1.030)
Urobilinogen, UA: 1 mg/dL (ref 0.0–1.0)
pH: 6 (ref 5.0–8.0)

## 2020-01-01 MED ORDER — FERROUS SULFATE 325 (65 FE) MG PO TBEC: 325.0000 mg | DELAYED_RELEASE_TABLET | ORAL | 2 refills | Status: DC

## 2020-01-01 NOTE — Progress Notes (Signed)
   Subjective:  Sylvia Pennington is a 29 y.o. G2P0010 at 87w1dbeing seen today for ongoing prenatal care.  She is currently monitored for the following issues for this high-risk pregnancy and has Syncope; Supervision of other normal pregnancy, antepartum; Late prenatal care affecting pregnancy, antepartum; GBS bacteriuria; Rubella non-immune status, antepartum; and Arrhythmia on their problem list.  Patient reports no complaints.  Contractions: Irritability. Vag. Bleeding: None.  Movement: Present. Denies leaking of fluid.   The following portions of the patient's history were reviewed and updated as appropriate: allergies, current medications, past family history, past medical history, past social history, past surgical history and problem list. Problem list updated.  Objective:   Vitals:   01/01/20 1523  BP: 120/74  Pulse: 92  Weight: 153 lb (69.4 kg)    Fetal Status:     Movement: Present     General:  Alert, oriented and cooperative. Patient is in no acute distress.  Skin: Skin is warm and dry. No rash noted.   Cardiovascular: Normal heart rate noted  Respiratory: Normal respiratory effort, no problems with respiration noted  Abdomen: Soft, gravid, appropriate for gestational age. Pain/Pressure: Present     Pelvic: Vag. Bleeding: None     SSE unremarkable, normal vaginal mucose, normal nulliparous cervix w/o lesions or discharge        Extremities: Normal range of motion.  Edema: None  Mental Status: Normal mood and affect. Normal behavior. Normal judgment and thought content.   Urinalysis:      Assessment and Plan:  Pregnancy: G2P0010 at 272w1d1. Supervision of other normal pregnancy, antepartum Patient here for new OB Labs already previously drawn in [facility] are unremarkable with exception of Rubella non immune and mild anemia (iron rx sent) Counseled on genetic testing, she would like to have it but would like to take care of paperwork next time Pap collected - Cytology -  PAP( [facility] 2. Late prenatal care affecting pregnancy, antepartum   3. GBS bacteriuria On initial labs from [facility]  4. Rubella non-immune status, antepartum MMR PP  5. Syncope, unspecified syncope type   6. 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 Plan had been to admit for observation and have full Cards-Electrophysiology evaluation in AM with possible provocative testing (flecainide challenge), however patient then left AMA I discussed with patient the importance of follow up on this issue, though unclear if we will be able to do further testing until the end of her pregnancy She is amenable to EP referral which was placed today - Ambulatory referral to Cardiac Electrophysiology  Preterm labor symptoms and general obstetric precautions including but not limited to vaginal bleeding, contractions, leaking of fluid and fetal movement were reviewed in detail with the patient. Please refer to After Visit Summary for other counseling recommendations.  Return in 4 weeks (on 01/29/2020).   EcClarnce FlockMD

## 2020-01-01 NOTE — Patient Instructions (Signed)
 Contraception Choices Contraception, also called birth control, refers to methods or devices that prevent pregnancy. Hormonal methods Contraceptive implant  A contraceptive implant is a thin, plastic tube that contains a hormone. It is inserted into the upper part of the arm. It can remain in place for up to 3 years. Progestin-only injections Progestin-only injections are injections of progestin, a synthetic form of the hormone progesterone. They are given every 3 months by a health care provider. Birth control pills  Birth control pills are pills that contain hormones that prevent pregnancy. They must be taken once a day, preferably at the same time each day. Birth control patch  The birth control patch contains hormones that prevent pregnancy. It is placed on the skin and must be changed once a week for three weeks and removed on the fourth week. A prescription is needed to use this method of contraception. Vaginal ring  A vaginal ring contains hormones that prevent pregnancy. It is placed in the vagina for three weeks and removed on the fourth week. After that, the process is repeated with a new ring. A prescription is needed to use this method of contraception. Emergency contraceptive Emergency contraceptives prevent pregnancy after unprotected sex. They come in pill form and can be taken up to 5 days after sex. They work best the sooner they are taken after having sex. Most emergency contraceptives are available without a prescription. This method should not be used as your only form of birth control. Barrier methods Female condom  A female condom is a thin sheath that is worn over the penis during sex. Condoms keep sperm from going inside a woman's body. They can be used with a spermicide to increase their effectiveness. They should be disposed after a single use. Female condom  A female condom is a soft, loose-fitting sheath that is put into the vagina before sex. The condom keeps  sperm from going inside a woman's body. They should be disposed after a single use. Diaphragm  A diaphragm is a soft, dome-shaped barrier. It is inserted into the vagina before sex, along with a spermicide. The diaphragm blocks sperm from entering the uterus, and the spermicide kills sperm. A diaphragm should be left in the vagina for 6-8 hours after sex and removed within 24 hours. A diaphragm is prescribed and fitted by a health care provider. A diaphragm should be replaced every 1-2 years, after giving birth, after gaining more than 15 lb (6.8 kg), and after pelvic surgery. Cervical cap  A cervical cap is a round, soft latex or plastic cup that fits over the cervix. It is inserted into the vagina before sex, along with spermicide. It blocks sperm from entering the uterus. The cap should be left in place for 6-8 hours after sex and removed within 48 hours. A cervical cap must be prescribed and fitted by a health care provider. It should be replaced every 2 years. Sponge  A sponge is a soft, circular piece of polyurethane foam with spermicide on it. The sponge helps block sperm from entering the uterus, and the spermicide kills sperm. To use it, you make it wet and then insert it into the vagina. It should be inserted before sex, left in for at least 6 hours after sex, and removed and thrown away within 30 hours. Spermicides Spermicides are chemicals that kill or block sperm from entering the cervix and uterus. They can come as a cream, jelly, suppository, foam, or tablet. A spermicide should be inserted into   the vagina with an applicator at least 10-15 minutes before sex to allow time for it to work. The process must be repeated every time you have sex. Spermicides do not require a prescription. Intrauterine contraception Intrauterine device (IUD) An IUD is a T-shaped device that is put in a woman's uterus. There are two types:  Hormone IUD.This type contains progestin, a synthetic form of the  hormone progesterone. This type can stay in place for 3-5 years.  Copper IUD.This type is wrapped in copper wire. It can stay in place for 10 years.  Permanent methods of contraception Female tubal ligation In this method, a woman's fallopian tubes are sealed, tied, or blocked during surgery to prevent eggs from traveling to the uterus. Hysteroscopic sterilization In this method, a small, flexible insert is placed into each fallopian tube. The inserts cause scar tissue to form in the fallopian tubes and block them, so sperm cannot reach an egg. The procedure takes about 3 months to be effective. Another form of birth control must be used during those 3 months. Female sterilization This is a procedure to tie off the tubes that carry sperm (vasectomy). After the procedure, the man can still ejaculate fluid (semen). Natural planning methods Natural family planning In this method, a couple does not have sex on days when the woman could become pregnant. Calendar method This means keeping track of the length of each menstrual cycle, identifying the days when pregnancy can happen, and not having sex on those days. Ovulation method In this method, a couple avoids sex during ovulation. Symptothermal method This method involves not having sex during ovulation. The woman typically checks for ovulation by watching changes in her temperature and in the consistency of cervical mucus. Post-ovulation method In this method, a couple waits to have sex until after ovulation. Summary  Contraception, also called birth control, means methods or devices that prevent pregnancy.  Hormonal methods of contraception include implants, injections, pills, patches, vaginal rings, and emergency contraceptives.  Barrier methods of contraception can include female condoms, female condoms, diaphragms, cervical caps, sponges, and spermicides.  There are two types of IUDs (intrauterine devices). An IUD can be put in a woman's  uterus to prevent pregnancy for 3-5 years.  Permanent sterilization can be done through a procedure for males, females, or both.  Natural family planning methods involve not having sex on days when the woman could become pregnant. This information is not intended to replace advice given to you by your health care provider. Make sure you discuss any questions you have with your health care provider. Document Revised: 05/31/2017 Document Reviewed: 07/01/2016 Else[employer] Patient Education  2020 [employer]   Breastfeeding  Choosing to breastfeed is one of the best decisions you can make for yourself and your baby. A change in hormones during pregnancy causes your breasts to make breast milk in your milk-producing glands. Hormones prevent breast milk from being released before your baby is born. They also prompt milk flow after birth. Once breastfeeding has begun, thoughts of your baby, as well as his or her sucking or crying, can stimulate the release of milk from your milk-producing glands. Benefits of breastfeeding Research shows that breastfeeding offers many health benefits for infants and mothers. It also offers a cost-free and convenient way to feed your baby. For your baby  Your first milk (colostrum) helps your baby's digestive system to function better.  Special cells in your milk (antibodies) help your baby to fight off infections.  Breastfed babies are   less likely to develop asthma, allergies, obesity, or type 2 diabetes. They are also at lower risk for sudden infant death syndrome (SIDS).  Nutrients in breast milk are better able to meet your baby's needs compared to infant formula.  Breast milk improves your baby's brain development. For you  Breastfeeding helps to create a very special bond between you and your baby.  Breastfeeding is convenient. Breast milk costs nothing and is always available at the correct temperature.  Breastfeeding helps to burn calories. It helps you  to lose the weight that you gained during pregnancy.  Breastfeeding makes your uterus return faster to its size before pregnancy. It also slows bleeding (lochia) after you give birth.  Breastfeeding helps to lower your risk of developing type 2 diabetes, osteoporosis, rheumatoid arthritis, cardiovascular disease, and breast, ovarian, uterine, and endometrial cancer later in life. Breastfeeding basics Starting breastfeeding  Find a comfortable place to sit or lie down, with your neck and back well-supported.  Place a pillow or a rolled-up blanket under your baby to bring him or her to the level of your breast (if you are seated). Nursing pillows are specially designed to help support your arms and your baby while you breastfeed.  Make sure that your baby's tummy (abdomen) is facing your abdomen.  Gently massage your breast. With your fingertips, massage from the outer edges of your breast inward toward the nipple. This encourages milk flow. If your milk flows slowly, you may need to continue this action during the feeding.  Support your breast with 4 fingers underneath and your thumb above your nipple (make the letter "C" with your hand). Make sure your fingers are well away from your nipple and your baby's mouth.  Stroke your baby's lips gently with your finger or nipple.  When your baby's mouth is open wide enough, quickly bring your baby to your breast, placing your entire nipple and as much of the areola as possible into your baby's mouth. The areola is the colored area around your nipple. ? More areola should be visible above your baby's upper lip than below the lower lip. ? Your baby's lips should be opened and extended outward (flanged) to ensure an adequate, comfortable latch. ? Your baby's tongue should be between his or her lower gum and your breast.  Make sure that your baby's mouth is correctly positioned around your nipple (latched). Your baby's lips should create a seal on your  breast and be turned out (everted).  It is common for your baby to suck about 2-3 minutes in order to start the flow of breast milk. Latching Teaching your baby how to latch onto your breast properly is very important. An improper latch can cause nipple pain, decreased milk supply, and poor weight gain in your baby. Also, if your baby is not latched onto your nipple properly, he or she may swallow some air during feeding. This can make your baby fussy. Burping your baby when you switch breasts during the feeding can help to get rid of the air. However, teaching your baby to latch on properly is still the best way to prevent fussiness from swallowing air while breastfeeding. Signs that your baby has successfully latched onto your nipple  Silent tugging or silent sucking, without causing you pain. Infant's lips should be extended outward (flanged).  Swallowing heard between every 3-4 sucks once your milk has started to flow (after your let-down milk reflex occurs).  Muscle movement above and in front of his or her   ears while sucking. Signs that your baby has not successfully latched onto your nipple  Sucking sounds or smacking sounds from your baby while breastfeeding.  Nipple pain. If you think your baby has not latched on correctly, slip your finger into the corner of your baby's mouth to break the suction and place it between your baby's gums. Attempt to start breastfeeding again. Signs of successful breastfeeding Signs from your baby  Your baby will gradually decrease the number of sucks or will completely stop sucking.  Your baby will fall asleep.  Your baby's body will relax.  Your baby will retain a small amount of milk in his or her mouth.  Your baby will let go of your breast by himself or herself. Signs from you  Breasts that have increased in firmness, weight, and size 1-3 hours after feeding.  Breasts that are softer immediately after breastfeeding.  Increased milk  volume, as well as a change in milk consistency and color by the fifth day of breastfeeding.  Nipples that are not sore, cracked, or bleeding. Signs that your baby is getting enough milk  Wetting at least 1-2 diapers during the first 24 hours after birth.  Wetting at least 5-6 diapers every 24 hours for the first week after birth. The urine should be clear or pale yellow by the age of 5 days.  Wetting 6-8 diapers every 24 hours as your baby continues to grow and develop.  At least 3 stools in a 24-hour period by the age of 5 days. The stool should be soft and yellow.  At least 3 stools in a 24-hour period by the age of 7 days. The stool should be seedy and yellow.  No loss of weight greater than 10% of birth weight during the first 3 days of life.  Average weight gain of 4-7 oz (113-198 g) per week after the age of 4 days.  Consistent daily weight gain by the age of 5 days, without weight loss after the age of 2 weeks. After a feeding, your baby may spit up a small amount of milk. This is normal. Breastfeeding frequency and duration Frequent feeding will help you make more milk and can prevent sore nipples and extremely full breasts (breast engorgement). Breastfeed when you feel the need to reduce the fullness of your breasts or when your baby shows signs of hunger. This is called "breastfeeding on demand." Signs that your baby is hungry include:  Increased alertness, activity, or restlessness.  Movement of the head from side to side.  Opening of the mouth when the corner of the mouth or cheek is stroked (rooting).  Increased sucking sounds, smacking lips, cooing, sighing, or squeaking.  Hand-to-mouth movements and sucking on fingers or hands.  Fussing or crying. Avoid introducing a pacifier to your baby in the first 4-6 weeks after your baby is born. After this time, you may choose to use a pacifier. Research has shown that pacifier use during the first year of a baby's life  decreases the risk of sudden infant death syndrome (SIDS). Allow your baby to feed on each breast as long as he or she wants. When your baby unlatches or falls asleep while feeding from the first breast, offer the second breast. Because newborns are often sleepy in the first few weeks of life, you may need to awaken your baby to get him or her to feed. Breastfeeding times will vary from baby to baby. However, the following rules can serve as a guide to   help you make sure that your baby is properly fed:  Newborns (babies 4 weeks of age or younger) may breastfeed every 1-3 hours.  Newborns should not go without breastfeeding for longer than 3 hours during the day or 5 hours during the night.  You should breastfeed your baby a minimum of 8 times in a 24-hour period. Breast milk pumping     Pumping and storing breast milk allows you to make sure that your baby is exclusively fed your breast milk, even at times when you are unable to breastfeed. This is especially important if you go back to work while you are still breastfeeding, or if you are not able to be present during feedings. Your lactation consultant can help you find a method of pumping that works best for you and give you guidelines about how long it is safe to store breast milk. Caring for your breasts while you breastfeed Nipples can become dry, cracked, and sore while breastfeeding. The following recommendations can help keep your breasts moisturized and healthy:  Avoid using soap on your nipples.  Wear a supportive bra designed especially for nursing. Avoid wearing underwire-style bras or extremely tight bras (sports bras).  Air-dry your nipples for 3-4 minutes after each feeding.  Use only cotton bra pads to absorb leaked breast milk. Leaking of breast milk between feedings is normal.  Use lanolin on your nipples after breastfeeding. Lanolin helps to maintain your skin's normal moisture barrier. Pure lanolin is not harmful (not  toxic) to your baby. You may also hand express a few drops of breast milk and gently massage that milk into your nipples and allow the milk to air-dry. In the first few weeks after giving birth, some women experience breast engorgement. Engorgement can make your breasts feel heavy, warm, and tender to the touch. Engorgement peaks within 3-5 days after you give birth. The following recommendations can help to ease engorgement:  Completely empty your breasts while breastfeeding or pumping. You may want to start by applying warm, moist heat (in the shower or with warm, water-soaked hand towels) just before feeding or pumping. This increases circulation and helps the milk flow. If your baby does not completely empty your breasts while breastfeeding, pump any extra milk after he or she is finished.  Apply ice packs to your breasts immediately after breastfeeding or pumping, unless this is too uncomfortable for you. To do this: ? Put ice in a plastic bag. ? Place a towel between your skin and the bag. ? Leave the ice on for 20 minutes, 2-3 times a day.  Make sure that your baby is latched on and positioned properly while breastfeeding. If engorgement persists after 48 hours of following these recommendations, contact your health care provider or a lactation consultant. Overall health care recommendations while breastfeeding  Eat 3 healthy meals and 3 snacks every day. Well-nourished mothers who are breastfeeding need an additional 450-500 calories a day. You can meet this requirement by increasing the amount of a balanced diet that you eat.  Drink enough water to keep your urine pale yellow or clear.  Rest often, relax, and continue to take your prenatal vitamins to prevent fatigue, stress, and low vitamin and mineral levels in your body (nutrient deficiencies).  Do not use any products that contain nicotine or tobacco, such as cigarettes and e-cigarettes. Your baby may be harmed by chemicals from  cigarettes that pass into breast milk and exposure to secondhand smoke. If you need help quitting, ask your   health care provider.  Avoid alcohol.  Do not use illegal drugs or marijuana.  Talk with your health care provider before taking any medicines. These include over-the-counter and prescription medicines as well as vitamins and herbal supplements. Some medicines that may be harmful to your baby can pass through breast milk.  It is possible to become pregnant while breastfeeding. If birth control is desired, ask your health care provider about options that will be safe while breastfeeding your baby. Where to find more information: [employer]: www.llli.org Contact a health care provider if:  You feel like you want to stop breastfeeding or have become frustrated with breastfeeding.  Your nipples are cracked or bleeding.  Your breasts are red, tender, or warm.  You have: ? Painful breasts or nipples. ? A swollen area on either breast. ? A fever or chills. ? Nausea or vomiting. ? Drainage other than breast milk from your nipples.  Your breasts do not become full before feedings by the fifth day after you give birth.  You feel sad and depressed.  Your baby is: ? Too sleepy to eat well. ? Having trouble sleeping. ? More than 1 week old and wetting fewer than 6 diapers in a 24-hour period. ? Not gaining weight by 5 days of age.  Your baby has fewer than 3 stools in a 24-hour period.  Your baby's skin or the white parts of his or her eyes become yellow. Get help right away if:  Your baby is overly tired (lethargic) and does not want to wake up and feed.  Your baby develops an unexplained fever. Summary  Breastfeeding offers many health benefits for infant and mothers.  Try to breastfeed your infant when he or she shows early signs of hunger.  Gently tickle or stroke your baby's lips with your finger or nipple to allow the baby to open his or her mouth.  Bring the baby to your breast. Make sure that much of the areola is in your baby's mouth. Offer one side and burp the baby before you offer the other side.  Talk with your health care provider or lactation consultant if you have questions or you face problems as you breastfeed. This information is not intended to replace advice given to you by your health care provider. Make sure you discuss any questions you have with your health care provider. Document Revised: 08/23/2017 Document Reviewed: 06/30/2016 Elsevier Patient Education  2020 [employer]  

## 2020-01-05 ENCOUNTER — Telehealth: Payer: Self-pay | Admitting: Family Medicine

## 2020-01-05 DIAGNOSIS — A599 Trichomoniasis, unspecified: Secondary | ICD-10-CM

## 2020-01-05 LAB — CYTOLOGY - PAP
Chlamydia: NEGATIVE
Comment: NEGATIVE
Comment: NORMAL
Diagnosis: NEGATIVE
Neisseria Gonorrhea: NEGATIVE

## 2020-01-05 LAB — OB RESULTS CONSOLE GC/CHLAMYDIA: Gonorrhea: NEGATIVE

## 2020-01-05 MED ORDER — METRONIDAZOLE 500 MG PO TABS: 2000.0000 mg | ORAL_TABLET | Freq: Once | ORAL | 0 refills | Status: AC

## 2020-01-05 NOTE — Telephone Encounter (Signed)
Please call patient to inform her that her pap smear was normal, however there was trichomonas present. I have sent treatment to her pharmacy, please offer treatment to her partner and emphasize that they should refrain from intercourse until at least one week has passed since both of them have completed treatment.

## 2020-01-06 NOTE — Telephone Encounter (Signed)
Called pt; VM is full. MyChart message sent.

## 2020-01-07 ENCOUNTER — Encounter: Payer: Medicaid Other | Admitting: Student

## 2020-01-08 ENCOUNTER — Encounter: Payer: Self-pay | Admitting: *Deleted

## 2020-01-20 ENCOUNTER — Ambulatory Visit: Payer: Medicaid Other

## 2020-01-30 ENCOUNTER — Ambulatory Visit (INDEPENDENT_AMBULATORY_CARE_PROVIDER_SITE_OTHER): Payer: Medicaid Other | Admitting: Medical

## 2020-01-30 ENCOUNTER — Encounter: Payer: Self-pay | Admitting: Medical

## 2020-01-30 ENCOUNTER — Ambulatory Visit: Payer: Medicaid Other

## 2020-01-30 ENCOUNTER — Other Ambulatory Visit: Payer: Self-pay

## 2020-01-30 ENCOUNTER — Other Ambulatory Visit (HOSPITAL_COMMUNITY)
Admission: RE | Admit: 2020-01-30 | Discharge: 2020-01-30 | Disposition: A | Discharge status: Home / Self Care | Payer: Medicaid Other | Source: Ambulatory Visit | Attending: Family Medicine | Admitting: Family Medicine

## 2020-01-30 VITALS — BP 120/75 | HR 87 | Wt 160.0 lb

## 2020-01-30 DIAGNOSIS — R8271 Bacteriuria: Secondary | ICD-10-CM

## 2020-01-30 DIAGNOSIS — Z3480 Encounter for supervision of other normal pregnancy, unspecified trimester: Secondary | ICD-10-CM

## 2020-01-30 DIAGNOSIS — I499 Cardiac arrhythmia, unspecified: Secondary | ICD-10-CM

## 2020-01-30 DIAGNOSIS — O0930 Supervision of pregnancy with insufficient antenatal care, unspecified trimester: Secondary | ICD-10-CM

## 2020-01-30 DIAGNOSIS — Z2839 Other underimmunization status: Secondary | ICD-10-CM

## 2020-01-30 DIAGNOSIS — O99891 Other specified diseases and conditions complicating pregnancy: Secondary | ICD-10-CM | POA: Diagnosis not present

## 2020-01-30 DIAGNOSIS — A599 Trichomoniasis, unspecified: Secondary | ICD-10-CM | POA: Diagnosis not present

## 2020-01-30 DIAGNOSIS — Z3A26 26 weeks gestation of pregnancy: Secondary | ICD-10-CM

## 2020-01-30 DIAGNOSIS — O0932 Supervision of pregnancy with insufficient antenatal care, second trimester: Secondary | ICD-10-CM | POA: Diagnosis not present

## 2020-01-30 DIAGNOSIS — Z23 Encounter for immunization: Secondary | ICD-10-CM

## 2020-01-30 DIAGNOSIS — Z283 Underimmunization status: Secondary | ICD-10-CM

## 2020-01-30 NOTE — Patient Instructions (Signed)
AREA PEDIATRIC/FAMILY PRACTICE PHYSICIANS  [place] ([address]) .  [facility] o Chambliss, MD; Eniola, MD; Hale, MD; Hensel, MD; McDiarmid, MD; McIntyer, MD; Neal, MD; Walden, MD o [address] [place], [place] [address] o [phone] o Mon-Fri 8:30-12:30, 1:30-5:00 o Providers come to see babies at [facility] o Accepting Medicaid . [facility] o Limited providers who accept newborns: Koirala, MD; Morrow, MD; Wolters, MD o [address], [place], [place] [address] o [phone] o Mon-Fri 8:00-5:30 o Babies seen by providers at [facility] o Does NOT accept Medicaid o Please call early in hospitalization for appointment (limited availability)  . [facility] o Mulberry, MD o [address] [place], [place] [address] o [phone] o Mon, Tue, Thur, Fri 8:30-5:00, Wed 10:00-7:00 (closed 1-2pm) o Babies seen by [facility] providers o Accepting Medicaid . [facility] - Pediatrician o Rubin, MD o [address], [place], [place] [address] o [phone] o Mon-Fri 8:30-5:00, Sat 8:30-12:00 o Provider comes to see babies at [facility] o Accepting Medicaid o Must have been referred from current patients or contacted office prior to delivery . [facility] ([facility]) o Brown, MD; Chandler, MD; Ettefagh, MD; Grant, MD; Lester, MD; McCormick, MD; McQueen, MD; Prose, MD; Simha, MD; Stanley, MD; Stryffeler, NP; Tebben, NP o [address], [place], [place] [address] o [phone] o Mon, Tue, Thur, Fri 8:30-5:30, Wed 9:30-5:30, Sat 8:30-12:30 o Babies seen by [facility] providers o Accepting Medicaid o Only accepting infants of first-time parents or siblings of current patients o Hospital discharge coordinator will make follow-up appointment . Jack Amos o [address],  [place], [place]  [address] o [phone]   Fax - [phone] . [facility] o [address], [place], [place]  [address] o Phone - [phone]   Fax - [phone] . Shilpa Gosrani o [address], [place], [place]  [address] o [phone]  [place] ([address]) . [facility] o Bates, MD; Brassfield, MD; Cooper, Cox, MD; MD; Davis, MD; Dovico, MD; Ettefaugh, MD; Little, MD; Lowe, MD; Keiffer, MD; Melvin, MD; Sumner, MD; Williams, MD o [address], [place], [place] [address] o [phone] o Mon-Fri 8:30-5:00 (extended evenings Mon-Thur as needed), Sat-Sun 10:00-1:00 o Providers come to see babies at [facility] o Accepting Medicaid for families of first-time babies and families with all children in the household age 3 and under. Must register with office prior to making appointment (M-F only). . [facility] o Henson, NP; Knapp, MD; Lalonde, MD; Tysinger, PA o [address] [place], [place] [address] o [phone] o Mon-Fri 8:00-5:00 o Babies seen by providers at [facility] o Does NOT accept Medicaid/Commercial Insurance Only . [facility] ([facility])  o Artis, MD; Barnes, MD; Bratton, MD; Coccaro, MD; Lockett Gardner, MD; Kramer, MD; Marshall, MD; Netherton, MD; Poleto, MD; Skinner, MD o [address] [place], [place] [address] o [phone] o Mon-Fri 8:30-5:30, Sat (Oct.-Mar.) 9:00-1:00 o Babies seen by providers at [facility] o Accepting Medicaid  [place] ([address]) . [facility] o Reid, MD; Warner, MD o [address], , [place] [address] o [phone] o Mon-Fri 8:30-5:00, Sat 8:30-12:00 o Providers come to see babies at [facility] o Does NOT accept Medicaid . [facility] o Becker, PA; Hagler, MD; Scifres, PA; Sun, MD; Swayne, MD o [address],  [place], [place] [address] o [phone] o Mon-Fri 8:00-5:00 o Babies seen by providers at [facility] o Does NOT accept Medicaid o Only accepting babies of parents who are patients o Please call early in hospitalization for appointment (limited availability) . [facility] o Clark, MD; Frye, MD; Kelleher, MD; Mack, NP; Miller, MD; O'Keller, MD; Patterson, NP; Pudlo, MD; Puzio, MD; Thomas, MD; Tucker, MD; Twiselton, MD o [address], [place], [place] [address] o [phone] o Mon-Fri 8:00-5:00, Sat 9:00-12:00 o Providers come to see babies at [facility] o Does NOT accept Medicaid  [place] ([address]) . [facility] o Limited providers accepting new patients: Brake, NP; Wharton, PA o [address], [place], [place] [address] o [phone] o Mon-Fri 8:00-5:00 o Babies seen by providers at [facility] o Does NOT accept Medicaid o Only accepting babies of parents who are patients o Please call early in hospitalization for appointment (limited availability) . [facility] o Gay, MD; Quinlan, MD o [address] [place], [place] [address] o [phone] (press 1 to schedule appointment) o Mon-Fri 8:00-5:00 o Providers come to see babies at [facility] o Does NOT accept Medicaid . [facility] o Mazer, MD o [address] [place], [place] [address] o [phone] o Mon-Fri 8:30-5:00 (lunch 12:30-1:00), extended hours by appointment only Wed 5:00-6:30 o Babies seen by [facility] providers o Accepting Medicaid . [facility] o Banks, MD; Jordan, MD; Koberlein, MD o [address], [place], [place] [address] o [phone] o Mon-Fri 8:00-5:00 o Babies seen by [facility] providers o Does NOT accept Medicaid . [facility] o Parker, MD; Hunter, MD; Wallace, DO o [address] [place], [place]  [address] o [phone] o Mon-Fri 8:00-5:00 o Babies seen by [facility] providers o Does NOT accept Medicaid . [facility] o Brandon, PA; Brecken, PA; Christy, NP; Dees, MD; DeClaire, MD; DeWeese, MD; Hansen, NP; Mills, NP; Parrish, NP; Smoot, NP; Summer, MD; Vapne, MD o [address] [place], [place] [address] o [phone] o Mon-Fri 8:30-5:00, Sat 10:00-1:00 o Providers come to see babies at [facility] o Does NOT accept Medicaid o Free prenatal information session Tuesdays at 4:45pm . [facility] o Bouska, MD; Gordon, PA; Jeffery, PA; Weber, PA o [address] [place] [address] o [phone] o Mon-Fri 7:30-5:30 o Babies seen by [facility] providers . [facility] o [address], [place], [place]  [address] o [phone]   Fax - [phone]  [place] ([address] & [address]) . [facility] o Reese, MD o [address] [place], [place] [address] o [phone] o Mon-Thur 8:00-6:00 o Providers come to see babies at [facility] o Accepting Medicaid . [facility] o Anderson, NP; Badger, MD; Beal, PA; Spencer, PA o [address] [place], [place] [address] o [phone] o Mon-Thur 7:30-7:30, Fri 7:30-4:30 o Babies seen by [facility] providers o Accepting Medicaid . [facility] o Agbuya, MD; Klett, NP; Romgoolam, MD o [address], [place], [place] [address] o [phone] o Mon-Fri 8:30-5:00, Sat 8:30-12:00 o Providers come to see babies at [facility] o Accepting Medicaid o Must have "Meet & Greet" appointment at office prior to delivery . [facility] ([facility]) o McCord,   MD; Wallace, MD; Wood, MD o [address], [place], [place] [address] o [phone] o Mon-Wed 8:00-6:00, Thur-Fri 8:00-5:00, Sat 9:00-12:00 o Providers come to  see babies at [facility] o Does NOT accept Medicaid o Only accepting siblings of current patients . [facility]  o [address], [place], [place]  [address] o [phone]   Fax - [phone] . [facility] o [address], [place], [place]  [address] o [phone]   Fax - [phone]  [place] ([address] & [address]) . [facility] o Cirigliano, DO; Matthews, DO o [address] , [place] [address] o [phone] o Mon-Fri 7:00-5:00 o Babies seen by [facility] providers o Does NOT accept Medicaid . [facility] o Briscoe, MD; Howley, PA; Moreira, PA o [address], [place], [place] [address] o [phone] o Mon-Fri 8:00-5:00 o Babies seen by [facility] providers o Accepting Medicaid . [facility] o Boyd, MD; Church, PA; Jones, NP; Osborn, PA o [address], , [place] [address] o [phone] o Mon-Fri 8:00-5:00 o Babies seen by providers at [facility] o Accepting Medicaid  [place] ([address]) . [facility] [place] o Wendling, DO o [address] [place], [place] [address] o [phone] o Mon-Fri 8:00-5:00 o Babies seen by [facility] providers o Does NOT accept Medicaid o Limited availability, please call early in hospitalization to schedule follow-up . [facility] o Calderon, PA; Cummings, MD; Dillard, MD; Martin, PA; Olson, MD; VanDeven, PA o [address], [place], [place] [address] o [phone] o Mon-Fri 8:30-5:00, Sat 9:00-12:00 o Babies seen by providers at [facility] o Accepting Medicaid o Please register online then schedule online or call office o [email or web address] . [facility] ([facility] o Hunter, NP; Kumar, MD; [place] Rogers, PA o [address], [place], [place] [address] o [phone] o Mon-Fri 8:00-5:00 o Babies seen by providers at [facility] o Accepting Medicaid . [facility] ([facility]) o Pyatt, MD; Kristi Fleenor, NP; West, MD o [address], [place], [place] [address] o [phone] o Mon-Fri 8:00-5:30, Sat&Sun by appointment (phones open at 8:30) o Babies seen by [facility] providers o Accepting Medicaid o Must be a first-time baby or sibling of current patient . [facility] [place]  o [address], [place], [place]  [address] o [phone]   Fax - [phone]  [place] ([address]) . [facility] o Brown, PA; Cowen, PA; Rice, MD; Helton, PA; Spry, MD o [address] [place], [place] [address] o [phone] o Mon-Thur 8:00-7:00, Fri 8:00-5:00, Sat 8:00-12:00, Sun 9:00-12:00 o Babies seen by [facility] providers o Accepting Medicaid . [facility] o Coe-Goins, MD; Marshall, MD; Pierre-Louis, MD o [address] [place], [place], [place] [address] o [phone] o Mon-Thur 8:00-5:00 o Babies seen by providers at [facility] o Accepting Medicaid . [facility] o Bratton, MD; Coe-Goins, MD; Hayes, MD; Lewis, MD; List, MD; Lott, MD; Marshall, MD; Moran, MD; O'Neal, MD; Pierre-Louis, MD; Pitonzo, MD; Scholer, MD; Spangle, MD o [address] [place], [place]   [address] o [phone] o Mon-Fri 8:00-5:30, Sat (Oct.-Mar.) 9:00-1:00 o Babies seen by providers at [facility] o Accepting Medicaid o Must fill out new patient packet, available online at [email or web address] . [facility] ([facility]) o Friddle, NP; Harris, NP; Kelly, NP; Logan, MD;  Melvin, PA; Poth, MD; Ramadoss, MD; Stanton, NP o [address], [place], [place] [address] o [phone] o Mon-Thur 8:00-5:30, Fri 8:00-5:00 o Babies seen by providers at [facility] o Accepting Medicaid  [place] ([address]) . [facility] o Dixon, PA; Summerton, MD; Pickard, MD; Tapia, PA o [address], [place], [place] [address] o [phone] o Mon-Fri 8:00-5:00 o Babies seen by providers at [facility] o Accepting Medicaid   [place] ([address]) . [facility] [place] o Masneri, DO; Meyers, MD; Nelson, PA o [address], [place], [place] [address] o [phone] o Mon-Fri 8:00-5:00 o Babies seen by providers at [facility] o Does NOT accept Medicaid o Limited appointment availability, please call early in hospitalization  . [facility] [place] o Kunedd, DO; McGowen, MD o [address], [place], [place] [address] o [phone] o Mon-Fri 8:00-5:00 o Babies seen by [facility] providers o Does NOT accept Medicaid . [facility] [place] o Cameron, MD; MacDonald, MD; Michaels, PA; Nayak, MD o [address] [place] [address], [place], [place] [address] o [phone] o Mon-Fri 8:00-5:00 o After hours clinic ([address] [place], [place] [address]) [phone] Mon-Fri 5:00-8:00, Sat 12:00-6:00, Sun 10:00-4:00 o Babies seen by [facility] providers o Accepting Medicaid . [facility] [place] o [address], [place], [place]  [address] o [phone]   Fax - [phone]  [place] ([address]) . [facility] [place] [facility] o Andy, MD o [address], [place], [place] [address] o [phone] o Mon-Fri 8:00-5:00 o Babies seen by [facility] providers o Does NOT accept Medicaid . [facility] [place] ([facility] [place]) o Eksir, MD o [address], [place], [place]  [address] o [phone] o Mon-Thur 8:00-7:00, Fri 8:00-5:00, Sat 8:00-12:00 o Babies seen by providers at [facility] o Accepting Medicaid - but does not have vaccinations in office (must be received elsewhere) o Limited availability, please call early in hospitalization  [place] ([address]) . [facility]  o Charlene Flemming, MD o [address],  [place] [address] o [phone]  Fax [phone]  Second Trimester of Pregnancy  The second trimester is from week 14 through week 27 (month 4 through 6). This is often the time in pregnancy that you feel your best. Often times, morning sickness has lessened or quit. You may have more energy, and you may get hungry more often. Your unborn baby is growing rapidly. At the end of the sixth month, he or she is about 9 inches long and weighs about 1 pounds. You will likely feel the baby move between 18 and 20 weeks of pregnancy. Follow these instructions at home: Medicines  Take over-the-counter and prescription medicines only as told by your doctor. Some medicines are safe and some medicines are not safe during pregnancy.  Take a prenatal vitamin that contains at least 600 micrograms (mcg) of folic acid.  If you have trouble pooping (constipation), take medicine that will make your stool soft (stool softener) if your doctor approves. Eating and drinking     Eat regular, healthy meals.  Avoid raw meat and uncooked cheese.  If you get low calcium from the food you eat, talk to your doctor about taking a daily calcium supplement.  Avoid foods that are high in fat and sugars, such as fried and sweet foods.  If you feel sick to your stomach (nauseous) or throw up (vomit): ? Eat 4 or 5 small meals a day instead of 3 large meals. ? Try eating a few soda crackers. ? Drink liquids between meals instead of during meals.  To prevent constipation: ? Eat foods that are high in fiber, like fresh fruits and vegetables, whole  grains, and beans. ? Drink enough fluids to keep your pee (urine) clear or pale yellow. Activity  Exercise only as told by your doctor. Stop exercising if you start to have cramps.  Do not exercise if it is too hot, too humid, or if you are in a place of great height (high altitude).  Avoid heavy lifting.  Wear low-heeled shoes. Sit and stand up straight.  You can continue to have sex unless your doctor tells you not to. Relieving pain and discomfort  Wear a good support bra if your breasts are tender.  Take warm water baths (sitz baths) to soothe pain or discomfort caused by hemorrhoids. Use hemorrhoid cream if your doctor approves.  Rest with your legs raised if you have leg cramps or low back pain.  If you develop puffy, bulging veins (varicose veins) in your legs: ? Wear support hose or compression stockings as told by your doctor. ? Raise (elevate) your feet for 15 minutes, 3-4 times a day. ? Limit salt in your food. Prenatal care  Write down your questions. Take them to your prenatal visits.  Keep all your prenatal visits as told by your doctor. This is important. Safety  Wear your seat belt when driving.  Make a list of emergency phone numbers, including numbers for family, friends, the hospital, and police and fire departments. General instructions  Ask your doctor about the right foods to eat or for help finding a counselor, if you need these services.  Ask your doctor about local prenatal classes. Begin classes before month 6 of your pregnancy.  Do not use hot tubs, steam rooms, or saunas.  Do not douche or use tampons or scented sanitary pads.  Do not cross your legs for long periods of time.  Visit your dentist if you have not done so. Use a soft toothbrush to brush your teeth. Floss gently.  Avoid all smoking, herbs, and alcohol. Avoid drugs that are not approved by your doctor.  Do not use any products that contain nicotine or tobacco, such as  cigarettes and e-cigarettes. If you need help quitting, ask your doctor.  Avoid cat litter boxes and soil used by cats. These carry germs that can cause birth defects in the baby and can cause a loss of your baby (miscarriage) or stillbirth. Contact a doctor if:  You have mild cramps or pressure in your lower belly.  You have pain when you pee (urinate).  You have bad smelling fluid coming from your vagina.  You continue to feel sick to your stomach (nauseous), throw up (vomit), or have watery poop (diarrhea).  You have a nagging pain in your belly area.  You feel dizzy. Get help right away if:  You have a fever.  You are leaking fluid from your vagina.  You have spotting or bleeding from your vagina.  You   have severe belly cramping or pain.  You lose or gain weight rapidly.  You have trouble catching your breath and have chest pain.  You notice sudden or extreme puffiness (swelling) of your face, hands, ankles, feet, or legs.  You have not felt the baby move in over an hour.  You have severe headaches that do not go away when you take medicine.  You have trouble seeing. Summary  The second trimester is from week 14 through week 27 (months 4 through 6). This is often the time in pregnancy that you feel your best.  To take care of yourself and your unborn baby, you will need to eat healthy meals, take medicines only if your doctor tells you to do so, and do activities that are safe for you and your baby.  Call your doctor if you get sick or if you notice anything unusual about your pregnancy. Also, call your doctor if you need help with the right food to eat, or if you want to know what activities are safe for you. This information is not intended to replace advice given to you by your health care provider. Make sure you discuss any questions you have with your health care provider. Document Revised: 09/20/2018 Document Reviewed: 07/04/2016 Elsevier Patient Education   2020 Elsevier Inc.  

## 2020-01-30 NOTE — Progress Notes (Signed)
   PRENATAL VISIT NOTE  Subjective:  Sylvia Pennington is a 29 y.o. G2P0010 at 104w2dbeing seen today for ongoing prenatal care.  She is currently monitored for the following issues for this high-risk pregnancy and has Syncope; Supervision of other normal pregnancy, antepartum; Late prenatal care affecting pregnancy, antepartum; GBS bacteriuria; Rubella non-immune status, antepartum; Arrhythmia; and Trichomonas infection on their problem list.  Patient reports no complaints.  Contractions: Not present. Vag. Bleeding: None.  Movement: Present. Denies leaking of fluid.   The following portions of the patient's history were reviewed and updated as appropriate: allergies, current medications, past family history, past medical history, past social history, past surgical history and problem list.   Objective:   Vitals:   01/30/20 1050  BP: 120/75  Pulse: 87  Weight: 160 lb (72.6 kg)    Fetal Status: Fetal Heart Rate (bpm): 139   Movement: Present     General:  Alert, oriented and cooperative. Patient is in no acute distress.  Skin: Skin is warm and dry. No rash noted.   Cardiovascular: Normal heart rate noted  Respiratory: Normal respiratory effort, no problems with respiration noted  Abdomen: Soft, gravid, appropriate for gestational age.  Pain/Pressure: Present     Pelvic: Cervical exam deferred        Extremities: Normal range of motion.  Edema: None  Mental Status: Normal mood and affect. Normal behavior. Normal judgment and thought content.   Assessment and Plan:  Pregnancy: G2P0010 at 252w2d. Supervision of other normal pregnancy, antepartum - Doing well - Anticipatory guidance for next visit given, discussed fasting labs needed for GTT  - Peds list given again today, importance of choosing Peds by 36 weeks discussed  - Follow-up growth [place] to confirm change of dating from previous [place]. Late prenatal care affecting pregnancy, antepartum  3. GBS bacteriuria - Treat in  labor  4. Trichomonas infection - Cervicovaginal ancillary only( [place]) - TOC today   5. Rubella non-immune status, antepartum - Needs PP MMR  6. Cardiac arrhythmia, unspecified cardiac arrhythmia type  Preterm labor symptoms and general obstetric precautions including but not limited to vaginal bleeding, contractions, leaking of fluid and fetal movement were reviewed in detail with the patient. Please refer to After Visit Summary for other counseling recommendations.   Return in about 2 weeks (around 02/13/2020) for LOB, 28 week labs (fasting), In-Person, any provider.  Future Appointments  Date Time Provider [place]8/20/2021 12:45 PM [facility]  JuKerry HoughPA-C

## 2020-02-02 ENCOUNTER — Ambulatory Visit: Payer: Medicaid Other | Attending: Maternal & Fetal Medicine

## 2020-02-02 LAB — CERVICOVAGINAL ANCILLARY ONLY
Comment: NEGATIVE
Trichomonas: POSITIVE — AB

## 2020-02-04 MED ORDER — METRONIDAZOLE 500 MG PO TABS: 2000.0000 mg | ORAL_TABLET | Freq: Once | ORAL | 0 refills | Status: AC

## 2020-02-04 NOTE — Addendum Note (Signed)
Addended by: Marny Lowenstein on: 02/04/2020 09:45 AM   Modules accepted: Orders

## 2020-02-19 ENCOUNTER — Other Ambulatory Visit: Payer: Self-pay

## 2020-02-19 DIAGNOSIS — Z3480 Encounter for supervision of other normal pregnancy, unspecified trimester: Secondary | ICD-10-CM

## 2020-02-20 ENCOUNTER — Encounter: Payer: Self-pay | Admitting: Nurse Practitioner

## 2020-02-20 ENCOUNTER — Other Ambulatory Visit: Payer: Medicaid Other

## 2020-02-20 ENCOUNTER — Encounter: Payer: Medicaid Other | Admitting: Nurse Practitioner

## 2020-03-03 ENCOUNTER — Other Ambulatory Visit: Payer: Self-pay

## 2020-03-03 ENCOUNTER — Encounter: Payer: Self-pay | Admitting: Advanced Practice Midwife

## 2020-03-03 ENCOUNTER — Other Ambulatory Visit (HOSPITAL_COMMUNITY)
Admission: RE | Admit: 2020-03-03 | Discharge: 2020-03-03 | Disposition: A | Discharge status: Home / Self Care | Payer: Medicaid Other | Source: Ambulatory Visit | Attending: Advanced Practice Midwife | Admitting: Advanced Practice Midwife

## 2020-03-03 ENCOUNTER — Ambulatory Visit (INDEPENDENT_AMBULATORY_CARE_PROVIDER_SITE_OTHER): Payer: Medicaid Other | Admitting: Advanced Practice Midwife

## 2020-03-03 VITALS — BP 118/62 | HR 96 | Wt 163.0 lb

## 2020-03-03 DIAGNOSIS — Z3A31 31 weeks gestation of pregnancy: Secondary | ICD-10-CM

## 2020-03-03 DIAGNOSIS — Z3480 Encounter for supervision of other normal pregnancy, unspecified trimester: Secondary | ICD-10-CM | POA: Insufficient documentation

## 2020-03-03 DIAGNOSIS — Z3483 Encounter for supervision of other normal pregnancy, third trimester: Secondary | ICD-10-CM

## 2020-03-03 MED ORDER — DOCUSATE SODIUM 100 MG PO CAPS
100.0000 mg | ORAL_CAPSULE | Freq: Two times a day (BID) | ORAL | 3 refills | Status: AC
Start: 2020-03-03 — End: ?

## 2020-03-03 NOTE — Progress Notes (Signed)
° °  PRENATAL VISIT NOTE  Subjective:  Sylvia Pennington is a 29 y.o. G2P0010 at [redacted]w[redacted]d being seen today for ongoing prenatal care.  She is currently monitored for the following issues for this low-risk pregnancy and has Syncope; Supervision of other normal pregnancy, antepartum; Late prenatal care affecting pregnancy, antepartum; GBS bacteriuria; Rubella non-immune status, antepartum; Arrhythmia; and Trichomonas infection on their problem list.  Patient reports no complaints.  Contractions: Not present. Vag. Bleeding: None.  Movement: Present. Denies leaking of fluid.   The following portions of the patient's history were reviewed and updated as appropriate: allergies, current medications, past family history, past medical history, past social history, past surgical history and problem list.   Objective:   Vitals:   03/03/20 1546  BP: 118/62  Pulse: 96  Weight: 163 lb (73.9 kg)    Fetal Status:   Fundal Height: 31 cm Movement: Present     General:  Alert, oriented and cooperative. Patient is in no acute distress.  Skin: Skin is warm and dry. No rash noted.   Cardiovascular: Normal heart rate noted  Respiratory: Normal respiratory effort, no problems with respiration noted  Abdomen: Soft, gravid, appropriate for gestational age.  Pain/Pressure: Present     Pelvic: Cervical exam deferred        Extremities: Normal range of motion.  Edema: None  Mental Status: Normal mood and affect. Normal behavior. Normal judgment and thought content.   Assessment and Plan:  Pregnancy: G2P0010 at [redacted]w[redacted]d 1. Supervision of other normal pregnancy, antepartum - TOC for trichomoniasis   2. [redacted] weeks gestation of pregnancy - Missed FU growth, will reschedule  - Missed GTT, will get scheduled for ASAP. It is too late in the day today to do a one hour (4:11pm)  Preterm labor symptoms and general obstetric precautions including but not limited to vaginal bleeding, contractions, leaking of fluid and fetal  movement were reviewed in detail with the patient. Please refer to After Visit Summary for other counseling recommendations.   Return in about 2 weeks (around 03/17/2020).  No future appointments.  Thressa Sheller DNP, CNM  03/03/20  4:21 PM

## 2020-03-05 LAB — CERVICOVAGINAL ANCILLARY ONLY
Bacterial Vaginitis (gardnerella): NEGATIVE
Candida Glabrata: POSITIVE — AB
Candida Vaginitis: NEGATIVE
Chlamydia: NEGATIVE
Comment: NEGATIVE
Comment: NEGATIVE
Comment: NEGATIVE
Comment: NEGATIVE
Comment: NEGATIVE
Comment: NORMAL
Neisseria Gonorrhea: NEGATIVE
Trichomonas: POSITIVE — AB

## 2020-03-05 MED ORDER — METRONIDAZOLE 500 MG PO TABS
2000.0000 mg | ORAL_TABLET | Freq: Once | ORAL | 1 refills | Status: AC
Start: 2020-03-05 — End: 2020-03-05

## 2020-03-05 NOTE — Addendum Note (Signed)
Addended by: Thressa Sheller D on: 03/05/2020 03:42 PM   Modules accepted: Orders

## 2020-03-08 ENCOUNTER — Encounter: Payer: Self-pay | Admitting: *Deleted

## 2020-03-08 ENCOUNTER — Telehealth: Payer: Self-pay | Admitting: *Deleted

## 2020-03-08 ENCOUNTER — Other Ambulatory Visit: Payer: Self-pay

## 2020-03-08 ENCOUNTER — Ambulatory Visit: Payer: Medicaid Other | Attending: Obstetrics and Gynecology

## 2020-03-08 DIAGNOSIS — Z3A31 31 weeks gestation of pregnancy: Secondary | ICD-10-CM

## 2020-03-08 DIAGNOSIS — Z362 Encounter for other antenatal screening follow-up: Secondary | ICD-10-CM | POA: Diagnosis present

## 2020-03-08 DIAGNOSIS — Z3687 Encounter for antenatal screening for uncertain dates: Secondary | ICD-10-CM

## 2020-03-08 NOTE — Telephone Encounter (Addendum)
-----   Message from Armando Reichert, CNM sent at 03/05/2020  3:40 PM EDT ----- Patient still +trichomoniasis. Please call her and review results and the importance of partner treatment. I will send in a prescription for her and there will be a refill for her partner. So please be sure that she takes all the medication and her partner takes the refill. It might be best to bring her in to the office to have this discussion in person because she has been three times now.   9/27  1036  Called pt and heard message stating that the mailbox is full - unable to leave VM message. MyChart message sent to pt however will continue to call in effort to speak w/pt.

## 2020-03-09 ENCOUNTER — Other Ambulatory Visit: Payer: Medicaid Other

## 2020-03-09 NOTE — Telephone Encounter (Signed)
Called patient and patient did not answer. Mailbox full and not able to leave a message Will send My Chart message.

## 2020-03-17 ENCOUNTER — Encounter: Payer: Self-pay | Admitting: Obstetrics and Gynecology

## 2020-03-17 ENCOUNTER — Telehealth (INDEPENDENT_AMBULATORY_CARE_PROVIDER_SITE_OTHER): Payer: Medicaid Other | Admitting: Obstetrics and Gynecology

## 2020-03-17 DIAGNOSIS — Z91199 Patient's noncompliance with other medical treatment and regimen due to unspecified reason: Secondary | ICD-10-CM

## 2020-03-17 DIAGNOSIS — Z5329 Procedure and treatment not carried out because of patient's decision for other reasons: Secondary | ICD-10-CM

## 2020-03-17 NOTE — Progress Notes (Signed)
Patient's virtual appointment will be rescheduled per her request. Patient not seen.

## 2020-03-17 NOTE — Progress Notes (Signed)
Wasn't available states she was un aware of visit and couldn't make it advised it was virtual and she stated to be rescheduled for tomorrow if possible in person.

## 2020-04-13 ENCOUNTER — Encounter: Payer: Medicaid Other | Admitting: Women's Health

## 2020-04-13 NOTE — Progress Notes (Deleted)
Subjective:  Sylvia Pennington is a 29 y.o. G2P0010 at [redacted]w[redacted]d being seen today for ongoing prenatal care.  She is currently monitored for the following issues for this high-risk pregnancy and has Syncope; Supervision of other normal pregnancy, antepartum; Late prenatal care affecting pregnancy, antepartum; GBS bacteriuria; Rubella non-immune status, antepartum; Arrhythmia; and Trichomonas infection on their problem list.  Patient reports {sx:[id number]}.   .  .   . Denies leaking of fluid.   The following portions of the patient's history were reviewed and updated as appropriate: allergies, current medications, past family history, past medical history, past social history, past surgical history and problem list. Problem list updated.  Objective:  There were no vitals filed for this visit.  Fetal Status:           General:  Alert, oriented and cooperative. Patient is in no acute distress.  Skin: Skin is warm and dry. No rash noted.   Cardiovascular: Normal heart rate noted  Respiratory: Normal respiratory effort, no problems with respiration noted  Abdomen: Soft, gravid, appropriate for gestational age.       Pelvic:       {Blank single:19197::"Cervical exam performed","Cervical exam deferred"}        Extremities: Normal range of motion.     Mental Status: Normal mood and affect. Normal behavior. Normal judgment and thought content.   Urinalysis:      Assessment and Plan:  Pregnancy: G2P0010 at [redacted]w[redacted]d  1. Supervision of other normal pregnancy, antepartum *** -discussed contraception, pt elects *** -pt has not had glucose done  2. Syncope, unspecified syncope type  3. GBS bacteriuria -treat in labor  4. Rubella non-immune status, antepartum -vaccination PP  5. Cardiac arrhythmia, unspecified cardiac arrhythmia type -? brugada on EKG 2019 -referred to cardiology at [facility] visit, pt reports ***  6. Trichomonas infection -+9/22, confirmed *** meds, partner *** meds  Term labor symptoms  and general obstetric precautions including but not limited to vaginal bleeding, contractions, leaking of fluid and fetal movement were reviewed in detail with the patient. I discussed the assessment and treatment plan with the patient. The patient was provided an opportunity to ask questions and all were answered. The patient agreed with the plan and demonstrated an understanding of the instructions. The patient was advised to call back or seek an in-person office evaluation/go to MAU at [facility] for any urgent or concerning symptoms. Please refer to After Visit Summary for other counseling recommendations.  No follow-ups on file.   Jailyn Leeson, Odie Sera, NP

## 2020-04-24 ENCOUNTER — Inpatient Hospital Stay (HOSPITAL_COMMUNITY)
Admission: AD | Admit: 2020-04-24 | Discharge: 2020-04-26 | DRG: 806 | Disposition: A | Discharge status: Home / Self Care | Payer: Medicaid Other | Attending: Obstetrics and Gynecology | Admitting: Obstetrics and Gynecology

## 2020-04-24 ENCOUNTER — Encounter (HOSPITAL_COMMUNITY): Payer: Self-pay | Admitting: Emergency Medicine

## 2020-04-24 ENCOUNTER — Inpatient Hospital Stay (HOSPITAL_COMMUNITY): Payer: Medicaid Other | Admitting: Anesthesiology

## 2020-04-24 ENCOUNTER — Other Ambulatory Visit: Payer: Self-pay

## 2020-04-24 DIAGNOSIS — O1413 Severe pre-eclampsia, third trimester: Secondary | ICD-10-CM

## 2020-04-24 DIAGNOSIS — D62 Acute posthemorrhagic anemia: Secondary | ICD-10-CM | POA: Diagnosis not present

## 2020-04-24 DIAGNOSIS — Z3A00 Weeks of gestation of pregnancy not specified: Secondary | ICD-10-CM | POA: Diagnosis not present

## 2020-04-24 DIAGNOSIS — O99824 Streptococcus B carrier state complicating childbirth: Secondary | ICD-10-CM | POA: Diagnosis not present

## 2020-04-24 DIAGNOSIS — A599 Trichomoniasis, unspecified: Secondary | ICD-10-CM | POA: Diagnosis not present

## 2020-04-24 DIAGNOSIS — F17210 Nicotine dependence, cigarettes, uncomplicated: Secondary | ICD-10-CM | POA: Diagnosis present

## 2020-04-24 DIAGNOSIS — R9431 Abnormal electrocardiogram [ECG] [EKG]: Secondary | ICD-10-CM | POA: Diagnosis not present

## 2020-04-24 DIAGNOSIS — Z20822 Contact with and (suspected) exposure to covid-19: Secondary | ICD-10-CM | POA: Diagnosis present

## 2020-04-24 DIAGNOSIS — O99324 Drug use complicating childbirth: Secondary | ICD-10-CM | POA: Diagnosis present

## 2020-04-24 DIAGNOSIS — F1410 Cocaine abuse, uncomplicated: Secondary | ICD-10-CM | POA: Diagnosis present

## 2020-04-24 DIAGNOSIS — O99334 Smoking (tobacco) complicating childbirth: Secondary | ICD-10-CM | POA: Diagnosis present

## 2020-04-24 DIAGNOSIS — Z9119 Patient's noncompliance with other medical treatment and regimen: Secondary | ICD-10-CM

## 2020-04-24 DIAGNOSIS — O1414 Severe pre-eclampsia complicating childbirth: Secondary | ICD-10-CM | POA: Diagnosis not present

## 2020-04-24 DIAGNOSIS — Z91199 Patient's noncompliance with other medical treatment and regimen due to unspecified reason: Secondary | ICD-10-CM

## 2020-04-24 DIAGNOSIS — O0930 Supervision of pregnancy with insufficient antenatal care, unspecified trimester: Secondary | ICD-10-CM

## 2020-04-24 DIAGNOSIS — Z87898 Personal history of other specified conditions: Secondary | ICD-10-CM

## 2020-04-24 DIAGNOSIS — R8271 Bacteriuria: Secondary | ICD-10-CM

## 2020-04-24 DIAGNOSIS — Z3A38 38 weeks gestation of pregnancy: Secondary | ICD-10-CM | POA: Diagnosis not present

## 2020-04-24 DIAGNOSIS — Z23 Encounter for immunization: Secondary | ICD-10-CM

## 2020-04-24 DIAGNOSIS — O411290 Chorioamnionitis, unspecified trimester, not applicable or unspecified: Secondary | ICD-10-CM | POA: Diagnosis not present

## 2020-04-24 DIAGNOSIS — Z8759 Personal history of other complications of pregnancy, childbirth and the puerperium: Secondary | ICD-10-CM | POA: Diagnosis not present

## 2020-04-24 DIAGNOSIS — O09899 Supervision of other high risk pregnancies, unspecified trimester: Secondary | ICD-10-CM

## 2020-04-24 DIAGNOSIS — M549 Dorsalgia, unspecified: Secondary | ICD-10-CM | POA: Diagnosis not present

## 2020-04-24 DIAGNOSIS — O1415 Severe pre-eclampsia, complicating the puerperium: Secondary | ICD-10-CM | POA: Diagnosis not present

## 2020-04-24 DIAGNOSIS — Z3490 Encounter for supervision of normal pregnancy, unspecified, unspecified trimester: Secondary | ICD-10-CM

## 2020-04-24 DIAGNOSIS — O26893 Other specified pregnancy related conditions, third trimester: Secondary | ICD-10-CM | POA: Diagnosis not present

## 2020-04-24 DIAGNOSIS — O9081 Anemia of the puerperium: Secondary | ICD-10-CM | POA: Diagnosis not present

## 2020-04-24 DIAGNOSIS — R109 Unspecified abdominal pain: Secondary | ICD-10-CM

## 2020-04-24 DIAGNOSIS — R011 Cardiac murmur, unspecified: Secondary | ICD-10-CM | POA: Diagnosis present

## 2020-04-24 DIAGNOSIS — F17200 Nicotine dependence, unspecified, uncomplicated: Secondary | ICD-10-CM | POA: Diagnosis present

## 2020-04-24 DIAGNOSIS — O9833 Other infections with a predominantly sexual mode of transmission complicating the puerperium: Secondary | ICD-10-CM | POA: Diagnosis not present

## 2020-04-24 DIAGNOSIS — IMO0001 Reserved for inherently not codable concepts without codable children: Secondary | ICD-10-CM | POA: Diagnosis present

## 2020-04-24 DIAGNOSIS — O99891 Other specified diseases and conditions complicating pregnancy: Secondary | ICD-10-CM

## 2020-04-24 DIAGNOSIS — Z3480 Encounter for supervision of other normal pregnancy, unspecified trimester: Secondary | ICD-10-CM

## 2020-04-24 LAB — RESPIRATORY PANEL BY RT PCR (FLU A&B, COVID)
Influenza A by PCR: NEGATIVE
Influenza B by PCR: NEGATIVE
SARS Coronavirus 2 by RT PCR: NEGATIVE

## 2020-04-24 LAB — TYPE AND SCREEN
ABO/RH(D): A POS
Antibody Screen: NEGATIVE

## 2020-04-24 LAB — CBC
HCT: 29 % — ABNORMAL LOW (ref 36.0–46.0)
HCT: 31.7 % — ABNORMAL LOW (ref 36.0–46.0)
Hemoglobin: 9.4 g/dL — ABNORMAL LOW (ref 12.0–15.0)
Hemoglobin: 10.1 g/dL — ABNORMAL LOW (ref 12.0–15.0)
MCH: 30.7 pg (ref 26.0–34.0)
MCH: 31.2 pg (ref 26.0–34.0)
MCHC: 32.4 g/dL (ref 30.0–36.0)
MCHC: 31.9 g/dL (ref 30.0–36.0)
MCV: 94.8 fL (ref 80.0–100.0)
MCV: 97.8 fL (ref 80.0–100.0)
Platelets: 242 10*3/uL (ref 150–400)
Platelets: 263 10*3/uL (ref 150–400)
RBC: 3.06 MIL/uL — ABNORMAL LOW (ref 3.87–5.11)
RBC: 3.24 MIL/uL — ABNORMAL LOW (ref 3.87–5.11)
RDW: 13.7 % (ref 11.5–15.5)
RDW: 14.1 % (ref 11.5–15.5)
WBC: 8.8 10*3/uL (ref 4.0–10.5)
WBC: 13.4 10*3/uL — ABNORMAL HIGH (ref 4.0–10.5)
nRBC: 0.3 % — ABNORMAL HIGH (ref 0.0–0.2)
nRBC: 0.2 % (ref 0.0–0.2)

## 2020-04-24 LAB — COMPREHENSIVE METABOLIC PANEL
ALT: 17 U/L (ref 0–44)
AST: 33 U/L (ref 15–41)
Albumin: 3.2 g/dL — ABNORMAL LOW (ref 3.5–5.0)
Alkaline Phosphatase: 151 U/L — ABNORMAL HIGH (ref 38–126)
Anion gap: 11 (ref 5–15)
BUN: 7 mg/dL (ref 6–20)
CO2: 21 mmol/L — ABNORMAL LOW (ref 22–32)
Calcium: 9.2 mg/dL (ref 8.9–10.3)
Chloride: 104 mmol/L (ref 98–111)
Creatinine, Ser: 0.73 mg/dL (ref 0.44–1.00)
GFR, Estimated: >60 mL/min (ref >60)
Glucose, Bld: 85 mg/dL (ref 70–99)
Potassium: 3.7 mmol/L (ref 3.5–5.1)
Sodium: 136 mmol/L (ref 135–145)
Total Bilirubin: 0.6 mg/dL (ref 0.3–1.2)
Total Protein: 6.8 g/dL (ref 6.5–8.1)

## 2020-04-24 LAB — PROTEIN / CREATININE RATIO, URINE
Creatinine, Urine: 18.36 mg/dL
Protein Creatinine Ratio: 0.33 mg/mg{Cre} — ABNORMAL HIGH (ref 0.00–0.15)
Total Protein, Urine: 6 mg/dL

## 2020-04-24 LAB — RPR: RPR Ser Ql: NONREACTIVE

## 2020-04-24 LAB — OB RESULTS CONSOLE GBS: GBS: POSITIVE

## 2020-04-24 MED ORDER — DIPHENHYDRAMINE HCL 50 MG/ML IJ SOLN: 12.5000 mg | INTRAMUSCULAR | Status: DC | PRN

## 2020-04-24 MED ORDER — LACTATED RINGERS IV SOLN: INTRAVENOUS | Status: DC

## 2020-04-24 MED ORDER — DIPHENHYDRAMINE HCL 50 MG/ML IJ SOLN
25.0000 mg | Freq: Once | INTRAMUSCULAR | Status: AC
  Administered 2020-04-24: 25 mg via INTRAVENOUS
  Filled 2020-04-24: qty 1

## 2020-04-24 MED ORDER — FENTANYL-BUPIVACAINE-NACL 0.5-0.125-0.9 MG/250ML-% EP SOLN
12.0000 mL/h | EPIDURAL | Status: DC | PRN
  Filled 2020-04-24: qty 250

## 2020-04-24 MED ORDER — LIDOCAINE HCL (PF) 1 % IJ SOLN: 30.0000 mL | INTRAMUSCULAR | Status: DC | PRN

## 2020-04-24 MED ORDER — OXYCODONE-ACETAMINOPHEN 5-325 MG PO TABS: 2.0000 | ORAL_TABLET | ORAL | Status: DC | PRN

## 2020-04-24 MED ORDER — ONDANSETRON HCL 4 MG/2ML IJ SOLN: 4.0000 mg | INTRAMUSCULAR | Status: DC | PRN

## 2020-04-24 MED ORDER — PHENYLEPHRINE 40 MCG/ML (10ML) SYRINGE FOR IV PUSH (FOR BLOOD PRESSURE SUPPORT): 80.0000 ug | PREFILLED_SYRINGE | INTRAVENOUS | Status: DC | PRN

## 2020-04-24 MED ORDER — TETANUS-DIPHTH-ACELL PERTUSSIS 5-2.5-18.5 LF-MCG/0.5 IM SUSY: 0.5000 mL | PREFILLED_SYRINGE | Freq: Once | INTRAMUSCULAR | Status: DC

## 2020-04-24 MED ORDER — COCONUT OIL OIL: 1.0000 "application " | TOPICAL_OIL | Status: DC | PRN

## 2020-04-24 MED ORDER — EPHEDRINE 5 MG/ML INJ: 10.0000 mg | INTRAVENOUS | Status: DC | PRN

## 2020-04-24 MED ORDER — MISOPROSTOL 200 MCG PO TABS
800.0000 ug | ORAL_TABLET | Freq: Once | ORAL | Status: AC
  Administered 2020-04-24: 800 ug via BUCCAL

## 2020-04-24 MED ORDER — PRENATAL MULTIVITAMIN CH
1.0000 | ORAL_TABLET | Freq: Every day | ORAL | Status: DC
  Filled 2020-04-24: qty 1

## 2020-04-24 MED ORDER — OXYCODONE-ACETAMINOPHEN 5-325 MG PO TABS: 1.0000 | ORAL_TABLET | ORAL | Status: DC | PRN

## 2020-04-24 MED ORDER — MAGNESIUM SULFATE 40 GM/1000ML IV SOLN
2.0000 g/h | INTRAVENOUS | Status: DC
  Filled 2020-04-24: qty 1000

## 2020-04-24 MED ORDER — ONDANSETRON HCL 4 MG/2ML IJ SOLN: 4.0000 mg | Freq: Four times a day (QID) | INTRAMUSCULAR | Status: DC | PRN

## 2020-04-24 MED ORDER — LACTATED RINGERS IV SOLN: 500.0000 mL | Freq: Once | INTRAVENOUS | Status: DC

## 2020-04-24 MED ORDER — TERBUTALINE SULFATE 1 MG/ML IJ SOLN: 0.2500 mg | Freq: Once | INTRAMUSCULAR | Status: DC | PRN

## 2020-04-24 MED ORDER — ACETAMINOPHEN 325 MG PO TABS: 650.0000 mg | ORAL_TABLET | ORAL | Status: DC | PRN

## 2020-04-24 MED ORDER — LIDOCAINE HCL (PF) 1 % IJ SOLN
INTRAMUSCULAR | Status: DC | PRN
  Administered 2020-04-24: 10 mL via EPIDURAL

## 2020-04-24 MED ORDER — SODIUM CHLORIDE (PF) 0.9 % IJ SOLN
INTRAMUSCULAR | Status: DC | PRN
  Administered 2020-04-24: 12 mL/h via EPIDURAL

## 2020-04-24 MED ORDER — SOD CITRATE-CITRIC ACID 500-334 MG/5ML PO SOLN: 30.0000 mL | ORAL | Status: DC | PRN

## 2020-04-24 MED ORDER — LABETALOL HCL 5 MG/ML IV SOLN
40.0000 mg | INTRAVENOUS | Status: DC | PRN
  Administered 2020-04-24 (×2): 40 mg via INTRAVENOUS
  Filled 2020-04-24 (×2): qty 8

## 2020-04-24 MED ORDER — BENZOCAINE-MENTHOL 20-0.5 % EX AERO
1.0000 "application " | INHALATION_SPRAY | CUTANEOUS | Status: DC | PRN
  Administered 2020-04-25: 1 via TOPICAL
  Filled 2020-04-24: qty 56

## 2020-04-24 MED ORDER — OXYTOCIN BOLUS FROM INFUSION: 333.0000 mL | Freq: Once | INTRAVENOUS | Status: DC

## 2020-04-24 MED ORDER — SIMETHICONE 80 MG PO CHEW: 80.0000 mg | CHEWABLE_TABLET | ORAL | Status: DC | PRN

## 2020-04-24 MED ORDER — ZOLPIDEM TARTRATE 5 MG PO TABS: 5.0000 mg | ORAL_TABLET | Freq: Every evening | ORAL | Status: DC | PRN

## 2020-04-24 MED ORDER — LABETALOL HCL 5 MG/ML IV SOLN
INTRAVENOUS | Status: AC
  Filled 2020-04-24: qty 4

## 2020-04-24 MED ORDER — ERYTHROMYCIN 5 MG/GM OP OINT
TOPICAL_OINTMENT | OPHTHALMIC | Status: AC
  Filled 2020-04-24: qty 1

## 2020-04-24 MED ORDER — MAGNESIUM SULFATE BOLUS VIA INFUSION
4.0000 g | Freq: Once | INTRAVENOUS | Status: AC
  Administered 2020-04-24: 4 g via INTRAVENOUS
  Filled 2020-04-24: qty 1000

## 2020-04-24 MED ORDER — LACTATED RINGERS IV SOLN: 500.0000 mL | INTRAVENOUS | Status: DC | PRN

## 2020-04-24 MED ORDER — MAGNESIUM SULFATE 2 GM/50ML IV SOLN: 2.0000 g | Freq: Once | INTRAVENOUS | Status: DC

## 2020-04-24 MED ORDER — ONDANSETRON HCL 4 MG PO TABS: 4.0000 mg | ORAL_TABLET | ORAL | Status: DC | PRN

## 2020-04-24 MED ORDER — PENICILLIN G POT IN DEXTROSE 60000 UNIT/ML IV SOLN
3.0000 10*6.[IU] | INTRAVENOUS | Status: DC
  Administered 2020-04-24 (×2): 3 10*6.[IU] via INTRAVENOUS
  Filled 2020-04-24 (×2): qty 50

## 2020-04-24 MED ORDER — FLEET ENEMA 7-19 GM/118ML RE ENEM: 1.0000 | ENEMA | RECTAL | Status: DC | PRN

## 2020-04-24 MED ORDER — FENTANYL CITRATE (PF) 100 MCG/2ML IJ SOLN
50.0000 ug | INTRAMUSCULAR | Status: DC | PRN
  Administered 2020-04-24: 100 ug via INTRAVENOUS
  Filled 2020-04-24: qty 2

## 2020-04-24 MED ORDER — LIDOCAINE HCL (PF) 1 % IJ SOLN
30.0000 mL | INTRAMUSCULAR | Status: AC | PRN
  Administered 2020-04-24: 30 mL via SUBCUTANEOUS
  Filled 2020-04-24: qty 30

## 2020-04-24 MED ORDER — OXYTOCIN-SODIUM CHLORIDE 30-0.9 UT/500ML-% IV SOLN
2.5000 [IU]/h | INTRAVENOUS | Status: DC
  Administered 2020-04-24: 2.5 [IU]/h via INTRAVENOUS

## 2020-04-24 MED ORDER — LABETALOL HCL 5 MG/ML IV SOLN
20.0000 mg | INTRAVENOUS | Status: DC | PRN
  Administered 2020-04-24 (×4): 20 mg via INTRAVENOUS
  Filled 2020-04-24 (×3): qty 4

## 2020-04-24 MED ORDER — SODIUM CHLORIDE 0.9 % IV SOLN
5.0000 10*6.[IU] | Freq: Once | INTRAVENOUS | Status: AC
  Administered 2020-04-24: 5 10*6.[IU] via INTRAVENOUS
  Filled 2020-04-24: qty 5

## 2020-04-24 MED ORDER — DIPHENHYDRAMINE HCL 25 MG PO CAPS: 25.0000 mg | ORAL_CAPSULE | Freq: Four times a day (QID) | ORAL | Status: DC | PRN

## 2020-04-24 MED ORDER — MAGNESIUM SULFATE 40 GM/1000ML IV SOLN
2.0000 g/h | INTRAVENOUS | Status: AC
  Administered 2020-04-24: 2 g/h via INTRAVENOUS
  Filled 2020-04-24: qty 1000

## 2020-04-24 MED ORDER — IBUPROFEN 600 MG PO TABS
600.0000 mg | ORAL_TABLET | Freq: Four times a day (QID) | ORAL | Status: DC
  Administered 2020-04-24 – 2020-04-25 (×3): 600 mg via ORAL
  Filled 2020-04-24 (×4): qty 1

## 2020-04-24 MED ORDER — OXYTOCIN-SODIUM CHLORIDE 30-0.9 UT/500ML-% IV SOLN: 2.5000 [IU]/h | INTRAVENOUS | Status: DC

## 2020-04-24 MED ORDER — DIBUCAINE (PERIANAL) 1 % EX OINT: 1.0000 "application " | TOPICAL_OINTMENT | CUTANEOUS | Status: DC | PRN

## 2020-04-24 MED ORDER — HYDRALAZINE HCL 20 MG/ML IJ SOLN: 10.0000 mg | INTRAMUSCULAR | Status: DC | PRN

## 2020-04-24 MED ORDER — TRANEXAMIC ACID-NACL 1000-0.7 MG/100ML-% IV SOLN
INTRAVENOUS | Status: AC
  Administered 2020-04-24: 1000 mg
  Filled 2020-04-24: qty 100

## 2020-04-24 MED ORDER — OXYTOCIN BOLUS FROM INFUSION
333.0000 mL | Freq: Once | INTRAVENOUS | Status: AC
  Administered 2020-04-24: 333 mL via INTRAVENOUS

## 2020-04-24 MED ORDER — FENTANYL CITRATE (PF) 100 MCG/2ML IJ SOLN: 50.0000 ug | INTRAMUSCULAR | Status: DC | PRN

## 2020-04-24 MED ORDER — LABETALOL HCL 5 MG/ML IV SOLN: 80.0000 mg | INTRAVENOUS | Status: DC | PRN

## 2020-04-24 MED ORDER — LACTATED RINGERS IV SOLN: INTRAVENOUS | Status: AC

## 2020-04-24 MED ORDER — WITCH HAZEL-GLYCERIN EX PADS: 1.0000 "application " | MEDICATED_PAD | CUTANEOUS | Status: DC | PRN

## 2020-04-24 MED ORDER — OXYTOCIN-SODIUM CHLORIDE 30-0.9 UT/500ML-% IV SOLN
1.0000 m[IU]/min | INTRAVENOUS | Status: DC
  Administered 2020-04-24: 2 m[IU]/min via INTRAVENOUS
  Filled 2020-04-24: qty 500

## 2020-04-24 MED ORDER — MISOPROSTOL 200 MCG PO TABS
ORAL_TABLET | ORAL | Status: AC
  Filled 2020-04-24: qty 4

## 2020-04-24 MED ORDER — MEASLES, MUMPS & RUBELLA VAC IJ SOLR
0.5000 mL | Freq: Once | INTRAMUSCULAR | Status: AC
  Administered 2020-04-25: 0.5 mL via SUBCUTANEOUS
  Filled 2020-04-24: qty 0.5

## 2020-04-24 MED ORDER — SENNOSIDES-DOCUSATE SODIUM 8.6-50 MG PO TABS
2.0000 | ORAL_TABLET | ORAL | Status: DC
  Administered 2020-04-24 – 2020-04-25 (×2): 2 via ORAL
  Filled 2020-04-24 (×2): qty 2

## 2020-04-24 NOTE — Anesthesia Procedure Notes (Signed)
Epidural Patient location during procedure: OB Start time: 04/24/2020 5:02 AM End time: 04/24/2020 5:05 AM  Staffing Anesthesiologist: Leilani Able, MD Performed: anesthesiologist   Preanesthetic Checklist Completed: patient identified, IV checked, site marked, risks and benefits discussed, surgical consent, monitors and equipment checked, pre-op evaluation and timeout performed  Epidural Patient position: sitting Prep: DuraPrep and site prepped and draped Patient monitoring: continuous pulse ox and blood pressure Approach: midline Location: L3-L4 Injection technique: LOR air  Needle:  Needle type: Tuohy  Needle gauge: 17 G Needle length: 9 cm and 9 Needle insertion depth: 6 cm Catheter type: closed end flexible Catheter size: 19 Gauge Catheter at skin depth: 11 cm Test dose: negative and Other  Assessment Events: blood not aspirated, injection not painful, no injection resistance, no paresthesia and negative IV test  Additional Notes Reason for block:procedure for pain

## 2020-04-24 NOTE — Anesthesia Preprocedure Evaluation (Signed)
Anesthesia Evaluation  Patient identified by MRN, date of birth, ID band Patient awake    Reviewed: Allergy & Precautions, H&P , Patient's Chart, lab work & pertinent test results  Airway Mallampati: I       Dental no notable dental hx.    Pulmonary Current Smoker,    Pulmonary exam normal        Cardiovascular negative cardio ROS Normal cardiovascular exam     Neuro/Psych negative neurological ROS     GI/Hepatic negative GI ROS, Neg liver ROS,   Endo/Other  negative endocrine ROS  Renal/GU negative Renal ROS  negative genitourinary   Musculoskeletal negative musculoskeletal ROS (+)   Abdominal Normal abdominal exam  (+)   Peds  Hematology negative hematology ROS (+)   Anesthesia Other Findings   Reproductive/Obstetrics (+) Pregnancy                             Anesthesia Physical Anesthesia Plan  ASA: II  Anesthesia Plan: Epidural   Post-op Pain Management:    Induction:   PONV Risk Score and Plan:   Airway Management Planned:   Additional Equipment:   Intra-op Plan:   Post-operative Plan:   Informed Consent: I have reviewed the patients History and Physical, chart, labs and discussed the procedure including the risks, benefits and alternatives for the proposed anesthesia with the patient or authorized representative who has indicated his/her understanding and acceptance.       Plan Discussed with:   Anesthesia Plan Comments:         Anesthesia Quick Evaluation

## 2020-04-24 NOTE — ED Triage Notes (Signed)
Patient reports low back pain/tightness onset this evening , G1P0 , 38 weeks , denies rupture of membranes or vaginal bleeding . Evaluated by PA at triage .

## 2020-04-24 NOTE — H&P (Addendum)
OBSTETRIC ADMISSION HISTORY AND PHYSICAL  Sylvia Pennington is a 29 y.o. female G2P0010 with IUP at 61w3dby 20w [place] for SOL. She reports +FMs, No LOF, no VB, no blurry vision, headaches or peripheral edema, and RUQ pain.  She plans on bottle feeding. She declines birth control. [facility]  Upon arrival to L&D had multiple severe range blood pressures.   Dating: By 20w [place]--->  Estimated Date of Delivery: 05/05/20  Sono:    @[redacted]w[redacted]d , CWD, normal anatomy, cephalic presentation, anterior placenta, 384g, 46% EFW   Prenatal History/Complications:  Late to prenatal care GBS positive Rubella non-immune Trichomonas in pregnancy without neg TOC H/o syncope, possible arrhythmia (syncopal events in 2019, did not have full evaluation)  Normocytic anemia   Past Medical History: Past Medical History:  Diagnosis Date  . Anxiety     Past Surgical History: History reviewed. No pertinent surgical history.  Obstetrical History: OB History    Gravida  2   Para  0   Term  0   Preterm  0   AB  1   Living  0     SAB  1   TAB  0   Ectopic  0   Multiple  0   Live Births  0           Social History Social History   Socioeconomic History  . Marital status: Married    Spouse name: Not on file  . Number of children: Not on file  . Years of education: Not on file  . Highest education level: Associate degree: occupational, tHotel manager or vocational program  Occupational History  . Not on file  Tobacco Use  . Smoking status: Current Every Day Smoker    Packs/day: 0.25    Years: 0.00    Pack years: 0.00  . Smokeless tobacco: Never Used  . Tobacco comment: 1-2 cig/day  Vaping Use  . Vaping Use: Never used  Substance and Sexual Activity  . Alcohol use: Not Currently  . Drug use: Not Currently    Types: Marijuana    Comment: states it has been a month  . Sexual activity: Yes    Birth control/protection: None  Other Topics Concern  .  Not on file  Social History Narrative   Late to care   Social Determinants of Health   Financial Resource Strain: Low Risk   . Difficulty of Paying Living Expenses: Not very hard  Food Insecurity: No Food Insecurity  . Worried About RCharity fundraiserin the Last Year: Never true  . Ran Out of Food in the Last Year: Never true  Transportation Needs: No Transportation Needs  . Lack of Transportation (Medical): No  . Lack of Transportation (Non-Medical): No  Physical Activity: Insufficiently Active  . Days of Exercise per Week: 3 days  . Minutes of Exercise per Session: 30 min  Stress: No Stress Concern Present  . Feeling of Stress : Only a little  Social Connections:   . Frequency of Communication with Friends and Family: Not on file  . Frequency of Social Gatherings with Friends and Family: Not on file  . Attends Religious Services: Not on file  . Active Member of Clubs or Organizations: Not on file  . Attends CArchivistMeetings: Not on file  . Marital Status: Not on file    Family History: Family History  Problem Relation Age of Onset  . Sudden Cardiac Death Cousin   .  Diabetes Mother     Allergies: No Known Allergies  Medications Prior to Admission  Medication Sig Dispense Refill Last Dose  . Blood Pressure Monitoring (BLOOD PRESSURE MONITOR AUTOMAT) DEVI 1 Device by Does not apply route daily. Automatic blood pressure cuff regular size. To monitor blood pressure regularly at home. ICD-10 code:Z34.90 1 each 0   . docusate sodium (COLACE) 100 MG capsule Take 1 capsule (100 mg total) by mouth 2 (two) times daily. 60 capsule 3   . ferrous sulfate 325 (65 FE) MG EC tablet Take 1 tablet (325 mg total) by mouth every other day. 30 tablet 2   . Misc. Devices (GOJJI WEIGHT SCALE) MISC 1 Device by Does not apply route daily as needed. To weight self daily as needed at home. ICD-10 code: Z34.90 1 each 0   . Prenatal Vit-Fe Phos-FA-Omega (VITAFOL GUMMIES)  3.33-0.333-34.8 MG CHEW Chew 3 each by mouth daily. 90 tablet 12      Review of Systems   All systems reviewed and negative except as stated in HPI  Blood pressure (!) 146/73, pulse 75, temperature 97.9 F (36.6 C), temperature source Oral, resp. rate 16, weight 83.6 kg, last menstrual period 07/12/2019, SpO2 100 %. General appearance: alert, cooperative, appears stated age and no distress Lungs: normal respiratory effort, no respiratory distress Heart: regular rate Abdomen: soft, non-tender DTR's 1+ patellar  Presentation: cephalic Fetal monitoringBaseline: 130 bpm, Variability: Good {> 6 bpm), Accelerations: Reactive and Decelerations: Absent Uterine activityFrequency: Every 3-5 minutes Dilation: 7 Effacement (%): 60 Station: -1, 0 Exam by:: Burnett Sheng, RN   Prenatal labs: ABO, Rh: --/--/A POS (11/13 0354) Antibody: NEG (11/13 0354) Rubella: <0.90 (07/12 1339) RPR: Non Reactive (07/12 1339)  HBsAg: Negative (07/12 1339)  HIV: Non Reactive (07/12 1339)  GBS:   positive 1 hr Glucola: missed appt, never f/u. Random glucose 83. Early A1c 5.5 Genetic screening  AFP negative Anatomy US normal  Prenatal Transfer Tool  Maternal Diabetes: No Genetic Screening: Normal Maternal Ultrasounds/Referrals: Normal Fetal Ultrasounds or other Referrals:  MFM Maternal Substance Abuse:  No Significant Maternal Medications:  None Significant Maternal Lab Results: Group B Strep positive and Other: Rubella non-immune  Results for orders placed or performed during the hospital encounter of 04/24/20 (from the past 24 hour(s))  Respiratory Panel by RT PCR (Flu A&B, Covid) - Nasopharyngeal Swab   Collection Time: 04/24/20  3:54 AM   Specimen: Nasopharyngeal Swab  Result Value Ref Range   SARS Coronavirus 2 by RT PCR NEGATIVE NEGATIVE   Influenza A by PCR NEGATIVE NEGATIVE   Influenza B by PCR NEGATIVE NEGATIVE  Type and screen [place]   Collection Time: 04/24/20   3:54 AM  Result Value Ref Range   ABO/RH(D) A POS    Antibody Screen NEG    Sample Expiration      04/27/2020,2359 Performed at [facility], [place] [address], [place], [place] [address]   CBC   Collection Time: 04/24/20  3:59 AM  Result Value Ref Range   WBC 8.8 4.0 - 10.5 K/uL   RBC 3.06 (L) 3.87 - 5.11 MIL/uL   Hemoglobin 9.4 (L) 12.0 - 15.0 g/dL   HCT 29.0 (L) 36 - 46 %   MCV 94.8 80.0 - 100.0 fL   MCH 30.7 26.0 - 34.0 pg   MCHC 32.4 30.0 - 36.0 g/dL   RDW 13.7 11.5 - 15.5 %   Platelets 242 150 - 400 K/uL   nRBC 0.3 (H)  0.0 - 0.2 %  Comprehensive metabolic panel   Collection Time: 04/24/20  4:25 AM  Result Value Ref Range   Sodium 136 135 - 145 mmol/L   Potassium 3.7 3.5 - 5.1 mmol/L   Chloride 104 98 - 111 mmol/L   CO2 21 (L) 22 - 32 mmol/L   Glucose, Bld 85 70 - 99 mg/dL   BUN 7 6 - 20 mg/dL   Creatinine, Ser 0.73 0.44 - 1.00 mg/dL   Calcium 9.2 8.9 - 10.3 mg/dL   Total Protein 6.8 6.5 - 8.1 g/dL   Albumin 3.2 (L) 3.5 - 5.0 g/dL   AST 33 15 - 41 U/L   ALT 17 0 - 44 U/L   Alkaline Phosphatase 151 (H) 38 - 126 U/L   Total Bilirubin 0.6 0.3 - 1.2 mg/dL   GFR, Estimated >60 >60 mL/min   Anion gap 11 5 - 15    Patient Active Problem List   Diagnosis Date Noted  . Pregnancy 04/24/2020  . Trichomonas infection 01/05/2020  . Arrhythmia 01/01/2020  . GBS bacteriuria 12/27/2019  . Rubella non-immune status, antepartum 12/27/2019  . Supervision of other normal pregnancy, antepartum 12/22/2019  . Late prenatal care affecting pregnancy, antepartum 12/22/2019  . Syncope 01/14/2018    Assessment/Plan:  Sylvia Pennington is a 29 y.o. G2P0010 at 63w3dhere for SOL. No history of HTN throughout pregnancy, although late to prenatal care. In MAU and on floor, has experienced elevated BP with several severe range systolic measurements. Magnesium bolus started 06644with plan for drip following. Pre-E labs normal thus far, still awaiting urine P:C.   #Labor: SOL. Expectant  management at this time.   #PEC w SF: based on multiple SR pressures upon arrival. Mag started 00347 AST, ALT, Cr, and PLT all wnl. Urine P:C pending.   #Hx of ?arrhythmia: as above, recurrent episodes of syncope in past, however did not follow through with workup as patient left AMA from hospital. Was referred to cardiology during pregnancy but was not seen. Monitor closely for any syncopal events.   #normocytic anemia: admission hgb 9.4  #Pain: Epidural placed upon arrival  #FWB: Cat 1 #ID:  PCN #MOF: bottle #MOC: declines #Circ:  declines #Late to prenatal care: plan for postpartum social work consult for resources and support #GBS positive: PCN started #Rubella non-immune: plan for PP MMR #Trichomonas in pregnancy without neg TOC   CEzequiel Essex MD  04/24/2020, 6:16 AM  I saw and evaluated the patient. I agree with the findings and the plan of care as documented in the resident's note.  JSharene Skeans MD OProvidence St. Joseph'S HospitalFamily Medicine Fellow, FSt. Luke'S Mccallfor WLifecare [facility]als [facility] [place]Jeffersonville

## 2020-04-24 NOTE — Progress Notes (Signed)
Sylvia Pennington is a 29 y.o. G2P0010 at [redacted]w[redacted]d by 20 week ultrasound admitted for SOL.  Subjective: Pt comfortable with epidural. Having intermittent pressure in lower back.   Objective: BP (!) 148/87   Pulse 76   Temp 98 F (36.7 C) (Oral)   Resp 17   Wt 83.6 kg   LMP 07/12/2019 (Within Weeks) Comment: January 2021  SpO2 100%   BMI 30.67 kg/m  I/O last 3 completed shifts: In: -  Out: 700 [Urine:700] Total I/O In: 1083.6 [P.O.:400; I.V.:633.6; IV Piggyback:50] Out: 1100 [Urine:1100]  FHT:  FHR 115, moderate variability, 10x10 accels, occasional early decels UC:   q2-3 mins SVE:   Dilation: Lip/rim Effacement (%): 100 Station: 0 Exam by:: Sylvia Pennington CNM  Labs: Lab Results  Component Value Date   WBC 8.8 04/24/2020   HGB 9.4 (L) 04/24/2020   HCT 29.0 (L) 04/24/2020   MCV 94.8 04/24/2020   PLT 242 04/24/2020    Assessment / Plan:  Labor: Progressing on Pitocin Preeclampsia:  on magnesium sulfate, no signs or symptoms of toxicity, intake and ouput balanced and pt asymptomatic Fetal Wellbeing:  Category I Pain Control:  Epidural Anticipated MOD:  NSVD  Sylvia Pennington, CNM 04/24/2020, 12:28 PM

## 2020-04-24 NOTE — Progress Notes (Signed)
After further chart digging, I spoke with Sylvia Pennington regarding her history of abnormal heart rhythm. 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 Episodes of abnormal heart rhythm cause her to become dizzy, light-headed, and loose consciousness. The last time she had a syncopal episode was several months prior to pregnancy. She also had one episode of dizziness and light headedness during pregnancy "a couple months ago" where she remained conscious and did not have syncope.   She has an active referral to cardiology; however has not yet made appointment because someone told her the cardiologist would likely not do anything until after she delivered.   She is currently asymptomatic. Radial pulse palpated with normal rate and rhythm. Cardiac auscultation demonstrated lengthened S1 and pronounced S2.  I instructed her to let us know if she begins feeling dizzy or light headed.    Fayette Pho, MD

## 2020-04-24 NOTE — ED Provider Notes (Signed)
[facility] EMERGENCY DEPARTMENT Provider Note   CSN: [id number] Arrival date & time: 04/24/20  0319     History Chief Complaint  Patient presents with  . Low Back Pain : [redacted] weeks Pregnant    Sylvia Pennington is a 29 y.o. female.  The history is provided by the patient and medical records.    29 y.o. G1P0 approx 38 weeks here with lower back pain and abdominal cramping that began about 1 hour ago.  Think she is having contractions.  She denies loss of fluid or bleeding.  No complications thus far in pregnancy.  States baby is vertex as far as she knows.  No fever or other recent illness.    Past Medical History:  Diagnosis Date  . Anxiety     Patient Active Problem List   Diagnosis Date Noted  . Trichomonas infection 01/05/2020  . Arrhythmia 01/01/2020  . GBS bacteriuria 12/27/2019  . Rubella non-immune status, antepartum 12/27/2019  . Supervision of other normal pregnancy, antepartum 12/22/2019  . Late prenatal care affecting pregnancy, antepartum 12/22/2019  . Syncope 01/14/2018    History reviewed. No pertinent surgical history.   OB History    Gravida  2   Para  0   Term  0   Preterm  0   AB  1   Living  0     SAB  1   TAB  0   Ectopic  0   Multiple  0   Live Births  0           Family History  Problem Relation Age of Onset  . Sudden Cardiac Death Cousin   . Diabetes Mother     Social History   Tobacco Use  . Smoking status: Current Every Day Smoker    Packs/day: 0.25    Years: 0.00    Pack years: 0.00  . Smokeless tobacco: Never Used  . Tobacco comment: 1-2 cig/day  Vaping Use  . Vaping Use: Never used  Substance Use Topics  . Alcohol use: Not Currently  . Drug use: Not Currently    Types: Marijuana    Comment: states it has been a month    Home Medications Prior to Admission medications   Medication Sig Start Date End Date Taking? Authorizing Provider  Blood Pressure Monitoring (BLOOD PRESSURE MONITOR  AUTOMAT) DEVI 1 Device by Does not apply route daily. Automatic blood pressure cuff regular size. To monitor blood pressure regularly at home. ICD-10 code:Z34.90 12/22/19   Judeth Horn, NP  docusate sodium (COLACE) 100 MG capsule Take 1 capsule (100 mg total) by mouth 2 (two) times daily. 03/03/20   Armando Reichert, CNM  ferrous sulfate 325 (65 FE) MG EC tablet Take 1 tablet (325 mg total) by mouth every other day. 01/01/20 06/29/20  Venora Maples, MD  Misc. Devices (GOJJI WEIGHT SCALE) MISC 1 Device by Does not apply route daily as needed. To weight self daily as needed at home. ICD-10 code: Z34.90 12/22/19   Judeth Horn, NP  Prenatal Vit-Fe Phos-FA-Omega (VITAFOL GUMMIES) 3.33-0.333-34.8 MG CHEW Chew 3 each by mouth daily. 12/22/19   Judeth Horn, NP    Allergies    Patient has no known allergies.  Review of Systems   Review of Systems  Gastrointestinal: Positive for abdominal pain.  All other systems reviewed and are negative.   Physical Exam Updated Vital Signs BP (!) 174/107 (BP Location: Right Arm)   Pulse 87   Temp 97.8  F (36.6 C) (Oral)   Resp (!) 22   LMP 07/12/2019 (Within Weeks) Comment: January 2021  SpO2 100%   Physical Exam Vitals and nursing note reviewed.  Constitutional:      Appearance: She is well-developed.     Comments: crying  HENT:     Head: Normocephalic and atraumatic.  Eyes:     Conjunctiva/sclera: Conjunctivae normal.     Pupils: Pupils are equal, round, and reactive to light.  Cardiovascular:     Rate and Rhythm: Normal rate and regular rhythm.     Heart sounds: Normal heart sounds.  Pulmonary:     Effort: Pulmonary effort is normal. No respiratory distress.     Breath sounds: Normal breath sounds. No rhonchi.  Abdominal:     General: Bowel sounds are normal.     Palpations: Abdomen is soft.     Tenderness: There is no abdominal tenderness. There is no rebound.     Comments: Gravid abdomen  Musculoskeletal:        General: Normal  range of motion.     Cervical back: Normal range of motion.  Skin:    General: Skin is warm and dry.  Neurological:     Mental Status: She is alert and oriented to person, place, and time.     ED Results / Procedures / Treatments   Labs (all labs ordered are listed, but only abnormal results are displayed) Labs Reviewed - No data to display  EKG None  Radiology No results found.  Procedures Procedures (including critical care time)  Medications Ordered in ED Medications - No data to display  ED Course  I have reviewed the triage vital signs and the nursing notes.  Pertinent labs & imaging results that were available during my care of the patient were reviewed by me and considered in my medical decision making (see chart for details).    MDM Rules/Calculators/A&P  29 year old G1, P0 approximately [redacted] weeks gestation here with concern of contractions.  Began having lower abdominal cramping and low back pain about 1 hour prior to arrival.  Denies loss of fluid or bleeding.  No complications thus far in pregnancy.  She is hemodynamically stable here.  Discussed with MAU midwife, Misty Stanley-- ok to transfer over for ongoing evaluation.  Final Clinical Impression(s) / ED Diagnoses Final diagnoses:  Abdominal cramping    Rx / DC Orders ED Discharge Orders    None       Garlon Hatchet, PA-C 04/24/20 0947    Palumbo, April, MD 04/24/20 0425

## 2020-04-24 NOTE — MAU Note (Signed)
Patient presents for contractions that she reports as "really close."  Denies LOF/VB.  Endorses + FM.  Reports no complications w/ her pregnancy. GBS +

## 2020-04-24 NOTE — Progress Notes (Signed)
Sylvia Pennington is a 29 y.o. G2P0010 at [redacted]w[redacted]d by 20 week ultrasound admitted for SOL.  Subjective: Pt sleeping and comfortable with epidural.  Objective: BP (!) 154/83    Pulse 68    Temp 98.6 F (37 C) (Oral)    Resp 16    Wt 83.6 kg    LMP 07/12/2019 (Within Weeks) Comment: January 2021   SpO2 100%    BMI 30.67 kg/m  I/O last 3 completed shifts: In: -  Out: 700 [Urine:700] Total I/O In: 675.6 [P.O.:250; I.V.:375.6; IV Piggyback:50] Out: 600 [Urine:600]  FHT: FHR: 115bpm, moderate variability, 10x10 accels, no decels  UC:   q3-4 mins SVE:   Dilation: 7 Effacement (%): 80 Station: -1 Exam by:: Mohanad Carsten CNM  Labs: Lab Results  Component Value Date   WBC 8.8 04/24/2020   HGB 9.4 (L) 04/24/2020   HCT 29.0 (L) 04/24/2020   MCV 94.8 04/24/2020   PLT 242 04/24/2020    Assessment / Plan:  Labor: progressing well on Pitocin, continue titration PRN per unit policy Preeclampsia:  on magnesium sulfate, no signs or symptoms of toxicity, intake and ouput balanced and PCR 0.33, otherwise labs WNL; s/p labetalol x1; pt remains asymptomatic Fetal Wellbeing:  Category I Pain Control:  Epidural Anticipated MOD:  NSVD  Sylvia Pennington, CNM 04/24/2020, 11:05 AM

## 2020-04-24 NOTE — Discharge Summary (Addendum)
Postpartum Discharge Summary     Patient Name: Sylvia Pennington DOB: 1990-11-24 MRN: [id number]  Date of admission: 04/24/2020 Delivery date:04/24/2020  Delivering provider: Renee Harder  Date of discharge: 04/26/2020  Admitting diagnosis: Pregnancy at 38/3. Active labor. Severe pre-eclampsia (BPs, PC ratio 330) Secondary diagnosis:  History of RBBB and concern for Brugada. Persistently + trichomonas during the pregnancy. GBS bacteruria.      Discharge diagnosis: Term Pregnancy Delivered. Newborn with +cocaine on UDS                                           Consults: Cardiology, Social Work  Post partum procedures:Maternal echo Augmentation: Pitocin Complications: EBL 2010OF  Hospital course: Onset of Labor With Vaginal Delivery      29 y.o. yo G2P0010 at 72w3dwas admitted in Active Labor on 04/24/2020. Patient had an uncomplicated labor course, with pitocin being used for augmentation. She had a shoulder dystocia requiring McRoberts, SP pressure, wood's screw and then posterior shoulder with total time approximately 30-60 seconds. [place] likely due to atony, which responded to active 3rd stage management, massage, pitocin, and Lysteda and cytotec.  Membrane Rupture Time/Date: 3:45 AM ,04/24/2020   Delivery Method:Vaginal, Spontaneous  Episiotomy:  None Lacerations:  1st degree;Labial (not repaired) ;2nd degree;Vaginal (repaired) Patient had an uncomplicated postpartum course with 24 hours of PP Mg given.  She is ambulating, tolerating a regular diet, passing flatus, and urinating well. Patient is discharged home in stable condition on 04/26/20.  Cardiology consulted with a normal echo with only mild LHV; normal ef of 60-65%. EKG showed, "04/25/2020: Normal sinus rhythm, 91 bpm, normal axis, RSR prime pattern leads V1 through V3.  Without underlying injury pattern." They set up outpatient follow up for her.   Patient signed off by SW and discharged home  with the baby.  Newborn Data: Birth date:04/24/2020  Birth time:3:51 PM  Gender:Female  Living status:Living  Apgars:5 ,7  Weight:3345 g   Magnesium Sulfate received: Yes: Seizure prophylaxis BMZ received: No Rhophylac:N/A MMR:Yes T-DaP:Given prenatally Flu: declined  Transfusion:No  Physical exam  Vitals:   04/26/20 0415 04/26/20 0813 04/26/20 0815 04/26/20 1159  BP:  (!) 152/78  129/63  Pulse:    93  Resp:  18  18  Temp:  98.1 F (36.7 C)  97.8 F (36.6 C)  TempSrc:  Oral  Oral  SpO2: 98% 100% 100% 100%  Weight:      Height:       General: alert Lochia: appropriate Uterine Fundus: firm, nttp Incision: N/A DVT Evaluation: No evidence of DVT seen on physical exam. Labs: Lab Results  Component Value Date   WBC 15.2 (H) 04/25/2020   HGB 7.1 (L) 04/25/2020   HCT 20.9 (L) 04/25/2020   MCV 92.9 04/25/2020   PLT 210 04/25/2020   CMP Latest Ref Rng & Units 04/25/2020  Glucose 70 - 99 mg/dL 111(H)  BUN 6 - 20 mg/dL 7  Creatinine 0.44 - 1.00 mg/dL 0.85  Sodium 135 - 145 mmol/L 134(L)  Potassium 3.5 - 5.1 mmol/L 3.8  Chloride 98 - 111 mmol/L 101  CO2 22 - 32 mmol/L 24  Calcium 8.9 - 10.3 mg/dL 7.8(L)  Total Protein 6.5 - 8.1 g/dL 4.9(L)  Total Bilirubin 0.3 - 1.2 mg/dL 0.3  Alkaline Phos 38 - 126 U/L 111  AST 15 - 41 U/L 31  ALT 0 - 44 U/L 15   Edinburgh Score: Edinburgh Postnatal Depression Scale Screening Tool 04/25/2020  I have been able to laugh and see the funny side of things. 0  I have looked forward with enjoyment to things. 0  I have blamed myself unnecessarily when things went wrong. 1  I have been anxious or worried for no good reason. 0  I have felt scared or panicky for no good reason. 0  Things have been getting on top of me. 1  I have been so unhappy that I have had difficulty sleeping. 0  I have felt sad or miserable. 0  I have been so unhappy that I have been crying. 0  The thought of harming myself has occurred to me. 0  Edinburgh  Postnatal Depression Scale Total 2     After visit meds:  Allergies as of 04/26/2020   No Known Allergies     Medication List    TAKE these medications   acetaminophen 325 MG tablet Commonly known as: TYLENOL Take 2 tablets (650 mg total) by mouth every 6 (six) hours as needed (for pain scale < 4ORtemperature>/=100.5 F).   Blood Pressure Monitor Automat Devi 1 Device by Does not apply route daily. Automatic blood pressure cuff regular size. To monitor blood pressure regularly at home. ICD-10 code:Z34.90   docusate sodium 100 MG capsule Commonly known as: Colace Take 1 capsule (100 mg total) by mouth 2 (two) times daily.   ferrous sulfate 325 (65 FE) MG EC tablet Take 1 tablet (325 mg total) by mouth every other day.   Gojji Weight Scale Misc 1 Device by Does not apply route daily as needed. To weight self daily as needed at home. ICD-10 code: Z34.90   ibuprofen 100 MG/5ML suspension Commonly known as: ADVIL Take 30 mLs (600 mg total) by mouth every 6 (six) hours as needed.   NIFEdipine 30 MG 24 hr tablet Commonly known as: ADALAT CC Take 1 tablet (30 mg total) by mouth daily. Start taking on: April 27, 2020   Vitafol Gummies 3.33-0.333-34.8 MG Chew Chew 3 each by mouth daily.        Discharge home in stable condition Infant Feeding: Bottle Infant Disposition:home with mother Discharge instruction: per After Visit Summary and Postpartum booklet. Activity: Advance as tolerated. Pelvic rest for 6 weeks.  Diet: routine diet Future Appointments: Future Appointments  Date Time Provider [place]  04/30/2020  8:50 AM [facility] NURSE [facility]  05/18/2020 10:00 AM Evans Lance, MD [facility]  06/02/2020  5:17 PM Arrie Senate, MD [facility]   Follow up Visit:  [place] [facility]. Go in 1 week(s).   Specialty: Obstetrics and Gynecology Why: call on  wednesday if you have not heard about your blood pressure check in one week Contact information: [address] [place] [address] [phone]               Please schedule this patient for a In person postpartum visit in 4 weeks with the following provider: Any provider. Additional Postpartum F/U:BP check 2-3 days  High risk pregnancy complicated by: HTN Delivery mode:  Vaginal, Spontaneous  Anticipated Birth Control:  Unsure   04/26/2020 Aletha Halim, MD

## 2020-04-24 NOTE — Progress Notes (Addendum)
Sylvia Pennington is a 29 y.o. G2P0010 at [redacted]w[redacted]d by 20 week ultrasound admitted for SOL.  Subjective: In to introduce self to pt. Pt doing well and comfortable with epidural. Has some itching on abdomen and requesting something for relief. Pt denies HA, vision changes, or RUQ pain.   Objective: BP (!) 149/86   Pulse 70   Temp (!) 97.3 F (36.3 C) (Oral)   Resp 16   Wt 83.6 kg   LMP 07/12/2019 (Within Weeks) Comment: January 2021  SpO2 100%   BMI 30.67 kg/m  I/O last 3 completed shifts: In: -  Out: 700 [Urine:700] No intake/output data recorded.  FHT:  FHR: 115 bpm, variability: moderate,  accelerations:  Present,  decelerations:  Absent UC:  q3-6 mins SVE:   Dilation: 7 Effacement (%): 80 Station: -1 Exam by:: Finneas Mathe CNM  Labs: Lab Results  Component Value Date   WBC 8.8 04/24/2020   HGB 9.4 (L) 04/24/2020   HCT 29.0 (L) 04/24/2020   MCV 94.8 04/24/2020   PLT 242 04/24/2020    Assessment / Plan:  Labor: cervix unchangedl; will start Pitocin per unit policy Preeclampsia:  on magnesium sulfate, no signs or symptoms of toxicity, intake and ouput balanced, labs stable and ; PCR pending at this time, pt asymptomatic Fetal Wellbeing:  Category I Pain Control:  Epidural Anticipated MOD:  NSVD  Brand Males, CNM 04/24/2020, 8:45 AM

## 2020-04-25 DIAGNOSIS — O1415 Severe pre-eclampsia, complicating the puerperium: Secondary | ICD-10-CM

## 2020-04-25 DIAGNOSIS — R9431 Abnormal electrocardiogram [ECG] [EKG]: Secondary | ICD-10-CM

## 2020-04-25 DIAGNOSIS — Z8759 Personal history of other complications of pregnancy, childbirth and the puerperium: Secondary | ICD-10-CM

## 2020-04-25 DIAGNOSIS — Z87898 Personal history of other specified conditions: Secondary | ICD-10-CM

## 2020-04-25 DIAGNOSIS — D62 Acute posthemorrhagic anemia: Secondary | ICD-10-CM | POA: Diagnosis not present

## 2020-04-25 DIAGNOSIS — F17200 Nicotine dependence, unspecified, uncomplicated: Secondary | ICD-10-CM | POA: Diagnosis present

## 2020-04-25 DIAGNOSIS — R011 Cardiac murmur, unspecified: Secondary | ICD-10-CM | POA: Diagnosis present

## 2020-04-25 DIAGNOSIS — Z91199 Patient's noncompliance with other medical treatment and regimen due to unspecified reason: Secondary | ICD-10-CM

## 2020-04-25 DIAGNOSIS — O9081 Anemia of the puerperium: Secondary | ICD-10-CM | POA: Diagnosis not present

## 2020-04-25 DIAGNOSIS — O1413 Severe pre-eclampsia, third trimester: Secondary | ICD-10-CM

## 2020-04-25 DIAGNOSIS — F1410 Cocaine abuse, uncomplicated: Secondary | ICD-10-CM | POA: Diagnosis present

## 2020-04-25 DIAGNOSIS — Z9119 Patient's noncompliance with other medical treatment and regimen: Secondary | ICD-10-CM

## 2020-04-25 LAB — CBC
HCT: 20.9 % — ABNORMAL LOW (ref 36.0–46.0)
Hemoglobin: 7.1 g/dL — ABNORMAL LOW (ref 12.0–15.0)
MCH: 31.6 pg (ref 26.0–34.0)
MCHC: 34 g/dL (ref 30.0–36.0)
MCV: 92.9 fL (ref 80.0–100.0)
Platelets: 210 10*3/uL (ref 150–400)
RBC: 2.25 MIL/uL — ABNORMAL LOW (ref 3.87–5.11)
RDW: 14.1 % (ref 11.5–15.5)
WBC: 15.2 10*3/uL — ABNORMAL HIGH (ref 4.0–10.5)
nRBC: 0 % (ref 0.0–0.2)

## 2020-04-25 LAB — COMPREHENSIVE METABOLIC PANEL
ALT: 15 U/L (ref 0–44)
AST: 31 U/L (ref 15–41)
Albumin: 2.2 g/dL — ABNORMAL LOW (ref 3.5–5.0)
Alkaline Phosphatase: 111 U/L (ref 38–126)
Anion gap: 9 (ref 5–15)
BUN: 7 mg/dL (ref 6–20)
CO2: 24 mmol/L (ref 22–32)
Calcium: 7.8 mg/dL — ABNORMAL LOW (ref 8.9–10.3)
Chloride: 101 mmol/L (ref 98–111)
Creatinine, Ser: 0.85 mg/dL (ref 0.44–1.00)
GFR, Estimated: >60 mL/min (ref >60)
Glucose, Bld: 111 mg/dL — ABNORMAL HIGH (ref 70–99)
Potassium: 3.8 mmol/L (ref 3.5–5.1)
Sodium: 134 mmol/L — ABNORMAL LOW (ref 135–145)
Total Bilirubin: 0.3 mg/dL (ref 0.3–1.2)
Total Protein: 4.9 g/dL — ABNORMAL LOW (ref 6.5–8.1)

## 2020-04-25 MED ORDER — IBUPROFEN 100 MG/5ML PO SUSP
600.0000 mg | Freq: Four times a day (QID) | ORAL | Status: DC
  Administered 2020-04-25 – 2020-04-26 (×4): 600 mg via ORAL
  Filled 2020-04-25 (×4): qty 30

## 2020-04-25 MED ORDER — SODIUM CHLORIDE 0.9 % IV SOLN
500.0000 mg | Freq: Once | INTRAVENOUS | Status: AC
  Administered 2020-04-25: 500 mg via INTRAVENOUS
  Filled 2020-04-25: qty 25

## 2020-04-25 MED ORDER — NIFEDIPINE ER OSMOTIC RELEASE 30 MG PO TB24
30.0000 mg | ORAL_TABLET | Freq: Every day | ORAL | Status: DC
  Administered 2020-04-25 – 2020-04-26 (×2): 30 mg via ORAL
  Filled 2020-04-25 (×2): qty 1

## 2020-04-25 NOTE — Progress Notes (Signed)
MOB on magnesium until 4pm. CSW will meet with MOB tomorrow to complete psychosocial assessment. CSW updated RN.   Celso Sickle, LCSW Clinical Social Worker [facility] Cell#: [phone]

## 2020-04-25 NOTE — Progress Notes (Signed)
Daily Postpartum Note  Admission Date: 04/24/2020 Current Date: 04/25/2020 7:11 AM  Sylvia Pennington is a 29 y.o. G2P1011 PPD#1 SVD/2nd degree laceration @ 38wks. , admitted for active labor. Patient diagnosed with severe pre-eclampsia, based on BPs, on admission  Pregnancy complicated by: Patient Active Problem List   Diagnosis Date Noted  . Pregnancy 04/24/2020  . Shoulder dystocia during labor and delivery 04/24/2020  . Trichomonas infection 01/05/2020  . Arrhythmia 01/01/2020  . GBS bacteriuria 12/27/2019  . Rubella non-immune status, antepartum 12/27/2019  . Supervision of other normal pregnancy, antepartum 12/22/2019  . Late prenatal care affecting pregnancy, antepartum 12/22/2019  . Syncope 01/14/2018    Overnight/24hr events:  none  Subjective:  No cardiac s/s and no s/s Mg toxicity or severe pre-eclampsia   Objective:    Current Vital Signs 24h Vital Sign Ranges  T 97.9 F (36.6 C) Temp  Avg: 98.4 F (36.9 C)  Min: 97.9 F (36.6 C)  Max: 99.1 F (37.3 C)  BP 132/75 BP  Min: 118/91  Max: 187/91  HR 89 Pulse  Avg: 81.3  Min: 68  Max: 99  RR 18 Resp  Avg: 16.9  Min: 16  Max: 19  SaO2 98 % Room Air SpO2  Avg: 98.7 %  Min: 97 %  Max: 100 %       24 Hour I/O Current Shift I/O  Time Ins Outs 11/13 0701 - 11/14 0700 In: 4341.8 [P.O.:1110; I.V.:3031.8] Out: 6371 [Urine:5355] No intake/output data recorded.   Patient Vitals for the past 24 hrs:  BP Temp Temp src Pulse Resp SpO2 Height Weight  04/25/20 0515 132/75 97.9 F (36.6 C) Oral 89 18 98 % -- --  04/24/20 2255 (!) 146/61 97.9 F (36.6 C) Axillary 88 18 98 % -- --  04/24/20 1943 140/79 -- -- 79 17 97 % -- --  04/24/20 1902 -- -- -- -- -- -- $Rem'5\' 6"'OWnX$  (1.676 m) 83.6 kg  04/24/20 1840 (!) 154/90 99.1 F (37.3 C) Oral 79 16 99 % -- --  04/24/20 1806 (!) 148/84 -- -- 84 -- -- -- --  04/24/20 1803 (!) 158/90 -- -- 87 16 -- -- --  04/24/20 1747 (!) 166/89 -- -- 88 16 -- -- --  04/24/20 1740 (!) 158/92 -- -- 89 -- --  -- --  04/24/20 1731 (!) 154/83 -- -- 91 16 -- -- --  04/24/20 1725 (!) 175/96 -- -- 89 -- -- -- --  04/24/20 1716 (!) 187/91 -- -- 90 17 -- -- --  04/24/20 1707 (!) 177/92 -- -- 93 17 -- -- --  04/24/20 1645 -- -- -- -- 17 -- -- --  04/24/20 1633 (!) 161/139 -- -- 99 17 -- -- --  04/24/20 1616 -- -- -- 78 -- -- -- --  04/24/20 1601 (!) 168/84 -- -- 92 18 -- -- --  04/24/20 1558 (!) 152/88 -- -- 83 18 -- -- --  04/24/20 1550 -- -- -- -- -- 100 % -- --  04/24/20 1545 -- -- -- -- -- 100 % -- --  04/24/20 1505 (!) 157/91 -- -- 79 19 -- -- --  04/24/20 1451 (!) 168/96 -- -- 80 -- -- -- --  04/24/20 1435 (!) 166/94 -- -- 80 18 -- -- --  04/24/20 1416 (!) 154/94 -- -- 80 -- -- -- --  04/24/20 1401 (!) 164/94 98.8 F (37.1 C) Oral 77 16 -- -- --  04/24/20 1346 (!) 154/91 -- -- 79 16 -- -- --  04/24/20 1331 (!) 151/123 -- -- 80 17 -- -- --  04/24/20 1301 (!) 155/92 -- -- 78 16 -- -- --  04/24/20 1231 138/86 -- -- 83 16 -- -- --  04/24/20 1209 (!) 148/87 98 F (36.7 C) Oral 76 17 -- -- --  04/24/20 1131 (!) 126/103 -- -- 76 17 -- -- --  04/24/20 1101 (!) 154/83 -- -- 68 16 -- -- --  04/24/20 1047 (!) 174/89 -- -- 72 -- -- -- --  04/24/20 1031 (!) 178/94 98.6 F (37 C) Oral 72 16 -- -- --  04/24/20 1001 (!) 149/64 -- -- 80 17 -- -- --  04/24/20 0946 (!) 149/99 -- -- 69 -- -- -- --  04/24/20 0943 (!) 149/99 -- -- 69 -- -- -- --  04/24/20 0932 (!) 121/106 -- -- 70 18 -- -- --  04/24/20 0912 (!) 118/91 -- -- 92 -- -- -- --  04/24/20 0901 (!) 167/98 -- -- 75 16 -- -- --  04/24/20 0831 (!) 145/92 98.2 F (36.8 C) Oral 78 17 -- -- --  04/24/20 0800 -- -- -- -- 17 -- -- --  04/24/20 0732 138/80 -- -- 78 18 -- -- --   UOP>17m/hr  Physical exam: General: Well nourished, well developed female in no acute distress. Abdomen: firm fundus below the umbilicus, nttp Cardiovascular: S1, S2 normal, no murmur, rub or gallop, regular rate and rhythm Respiratory: CTAB Extremities: no clubbing,  cyanosis or edema Skin: Warm and dry.   Medications: Current Facility-Administered Medications  Medication Dose Route Frequency Provider Last Rate Last Admin  . acetaminophen (TYLENOL) tablet 650 mg  650 mg Oral Q4H PRN LEzequiel Essex MD      . acetaminophen (TYLENOL) tablet 650 mg  650 mg Oral Q4H PRN SRenee Harder CNM      . benzocaine-Menthol (DERMOPLAST) 20-0.5 % topical spray 1 application  1 application Topical PRN SRenee Harder CNM      . coconut oil  1 application Topical PRN SRenee Harder CNM      . witch hazel-glycerin (TUCKS) pad 1 application  1 application Topical PRN Simpson, Danielle L, CNM       And  . dibucaine (NUPERCAINAL) 1 % rectal ointment 1 application  1 application Rectal PRN SRenee Harder CNM      . diphenhydrAMINE (BENADRYL) capsule 25 mg  25 mg Oral Q6H PRN SRenee Harder CNM      . fentaNYL (SUBLIMAZE) injection 50-100 mcg  50-100 mcg Intravenous Q1H PRN LEzequiel Essex MD   100 mcg at 04/24/20 0447  . ibuprofen (ADVIL) tablet 600 mg  600 mg Oral Q6H Simpson, Danielle L, CNM   600 mg at 04/25/20 0552  . lactated ringers infusion   Intravenous Continuous PAletha Halim MD 75 mL/hr at 04/25/20 0629 New Bag at 04/25/20 0629  . magnesium sulfate 40 grams in SWI 1000 mL OB infusion  2 g/hr Intravenous Continuous PAletha Halim MD 50 mL/hr at 04/24/20 2158 2 g/hr at 04/24/20 2158  . measles, mumps & rubella vaccine (MMR) injection 0.5 mL  0.5 mL Subcutaneous Once SRenee Harder CNM      . ondansetron (ZOFRAN) injection 4 mg  4 mg Intravenous Q6H PRN LEzequiel Essex MD      . ondansetron ([facility] tablet 4 mg  4 mg Oral Q4H PRN Simpson, Danielle L, CNM       Or  . ondansetron (ZOFRAN) injection 4 mg  4 mg Intravenous  Q4H PRN Renee Harder, CNM      . oxyCODONE-acetaminophen (PERCOCET/ROXICET) 5-325 MG per tablet 1 tablet  1 tablet Oral Q4H PRN Ezequiel Essex, MD      . oxyCODONE-acetaminophen (PERCOCET/ROXICET)  5-325 MG per tablet 2 tablet  2 tablet Oral Q4H PRN Ezequiel Essex, MD      . oxytocin (PITOCIN) IV infusion 30 units in NS 500 mL - Premix  2.5 Units/hr Intravenous Continuous Ezequiel Essex, MD   Stopped at 04/24/20 1830  . prenatal multivitamin tablet 1 tablet  1 tablet Oral Q1200 Simpson, Danielle L, CNM      . senna-docusate (Senokot-S) tablet 2 tablet  2 tablet Oral Q24H Renee Harder, CNM   2 tablet at 04/24/20 2359  . simethicone (MYLICON) chewable tablet 80 mg  80 mg Oral PRN Renee Harder, CNM      . sodium citrate-citric acid (ORACIT) solution 30 mL  30 mL Oral Q2H PRN Ezequiel Essex, MD      . sodium phosphate (FLEET) 7-19 GM/118ML enema 1 enema  1 enema Rectal PRN Ezequiel Essex, MD      . Tdap (BOOSTRIX) injection 0.5 mL  0.5 mL Intramuscular Once Renee Harder, CNM      . zolpidem (AMBIEN) tablet 5 mg  5 mg Oral QHS PRN Renee Harder, CNM        Labs:  Recent Labs  Lab 04/24/20 0359 04/24/20 1634 04/25/20 0600  WBC 8.8 13.4* 15.2*  HGB 9.4* 10.1* 7.1*  HCT 29.0* 31.7* 20.9*  PLT 242 263 210    Recent Labs  Lab 04/24/20 0425 04/25/20 0600  NA 136 134*  K 3.7 3.8  CL 104 101  CO2 21* 24  BUN 7 7  CREATININE 0.73 0.85  CALCIUM 9.2 7.8*  PROT 6.8 4.9*  BILITOT 0.6 0.3  ALKPHOS 151* 111  ALT 17 15  AST 33 31  GLUCOSE 85 111*     Radiology:  No new imaging  Assessment & Plan:  Pt stable *Pregnancy: formula feeding. Pt undecided on birth control. No circumcision. A POS. MMR ordered *CV: ECG just done. See 2019 consult from Dr. Cathie Olden. Call cardiology to review and see if anything needs to be done while inpatient and what kind of meds would be okay if anti-hypertensives are needed *Severe pre-x: Mg until 1600 today *PPx: SCDs, OOB ad lib after the Mg *FEN/GI: regular diet, MIVF with Mg *Dispo: likely PPD#2  Durene Romans MD Attending [facility] [place] [facility]ulty Practice[facility] [place] Phone: [phone] (M-F,  0800-1700) & [phone] (Off hours, weekends, holidays)

## 2020-04-25 NOTE — Progress Notes (Signed)
CSW acknowledged consult and completed chart review. CSW will meet with MOB once magnesium is discontinued and complete psychosocial assessment. CSW made a [facility] CPS report due to infant's positive UDS for cocaine.  Currently there are barriers to infant discharging home to [facility].   Celso Sickle, LCSW Clinical Social Worker [facility] Cell#: [phone]

## 2020-04-25 NOTE — Consult Note (Signed)
CARDIOLOGY CONSULT NOTE  Patient ID: Sylvia Pennington MRN: [id number] DOB/AGE: 10-09-1990 29 y.o.  Admit date: 04/24/2020 Attending physician: [place] Bing, MD Primary Physician:  Patient, No Pcp Per Outpatient Cardiologist: None Inpatient Cardiologist: Tessa Lerner, DO, [employer]  Chief complaint: Abnormal EKG  HPI:  Sylvia Pennington is a 29 y.o. African-American female who presents with a chief complaint of " contractions/active labor." Her past medical history and cardiovascular risk factors include: History of syncope, anxiety, cocaine use, active smoker, alcohol consumption.  Patient presents to the hospital with active contractions and was diagnosed with active labor and severe preeclampsia.  She gave birth to a baby boy today.  Patient is progressing well postpartum and cardiology consult was requested due to elevated blood pressures from severe preeclampsia and abnormal EKG. Patient is currently hypertensive and has been started on Procardia per OB/GYN.  At the time of evaluation patient is resting in bed comfortably and not in any acute distress.  She denies any chest pain or anginal equivalent.  She denies any shortness of breath or heart failure symptoms.   Patient was last seen by cardiology Dr. Elease Hashimoto in August 2019 after coming to the hospital due to syncope.  In the past she had recurrent syncopal events and her EKG was concerning for Brugada pattern and  was supposed to be evaluated by cardiac electrophysiology and undergo pharmacological challenge with flecainide.  However, patient left AMA and never followed up with cardiology.  Primary service is recommending further evaluation during this hospitalization as she is noncompliant with follow-up.  Since 2019 she has not had any recurrence of syncope.  Unfortunately, during her pregnancy she was smoking cigarettes, drinking alcohol, and cocaine use.  Given the patient's history of noncompliance, history of syncope, and EKG findings  cardiology was consulted for further recommendations.  ALLERGIES: No Known Allergies  PAST MEDICAL HISTORY: Past Medical History:  Diagnosis Date  . Anxiety   History of syncope.  PAST SURGICAL HISTORY: History reviewed. No pertinent surgical history.  None, except vaginal delivery today  FAMILY HISTORY: The patient family history includes Diabetes in her mother; Sudden Cardiac Death in her cousin.   SOCIAL HISTORY:  The patient  reports that she has been smoking. She has been smoking about 0.25 packs per day for the past 0.00 years. She has never used smokeless tobacco. She reports previous alcohol use. She reports previous drug use. Drug: Marijuana.  Smokes cocaine claims that the last use was less than 1 month ago.  Thinks alcohol last use less than 1 month ago.  MEDICATIONS: Current Outpatient Medications  Medication Instructions  . Blood Pressure Monitoring (BLOOD PRESSURE MONITOR AUTOMAT) DEVI 1 Device, Does not apply, Daily, Automatic blood pressure cuff regular size. To monitor blood pressure regularly at home. ICD-10 code:Z34.90  . docusate sodium (COLACE) 100 mg, Oral, 2 times daily  . ferrous sulfate 325 mg, Oral, Every other day  . Misc. Devices (GOJJI WEIGHT SCALE) MISC 1 Device, Does not apply, Daily PRN, To weight self daily as needed at home. ICD-10 code: Z34.90  . Prenatal Vit-Fe Phos-FA-Omega (VITAFOL GUMMIES) 3.33-0.333-34.8 MG CHEW 3 each, Oral, Daily    14 ORGAN REVIEW OF SYSTEMS: Review of Systems  Constitutional: Negative for chills and fever.  HENT: Negative for hoarse voice and nosebleeds.   Eyes: Negative for discharge, double vision and pain.  Cardiovascular: Negative for chest pain, claudication, dyspnea on exertion, leg swelling, near-syncope, orthopnea, palpitations, paroxysmal nocturnal dyspnea and syncope.  Respiratory: Negative for hemoptysis and shortness of  breath.   Musculoskeletal: Negative for muscle cramps and myalgias.  Gastrointestinal:  Negative for abdominal pain, constipation, diarrhea, hematemesis, hematochezia, melena, nausea and vomiting.  Genitourinary: Positive for hematuria (recent vaginal delivery). Negative for dysuria and flank pain.  Neurological: Negative for dizziness and light-headedness.  All other systems reviewed and are negative.   PHYSICAL EXAM: Vitals with BMI 04/25/2020 04/25/2020 04/25/2020  Height - - -  Weight - - -  BMI - - -  Systolic 156 148 263  Diastolic 83 69 69  Pulse 90 99 99     Intake/Output Summary (Last 24 hours) at 04/25/2020 1658 Last data filed at 04/25/2020 1447 Gross per 24 hour  Intake 3905.01 ml  Output 5455 ml  Net -1549.99 ml    Net IO Since Admission: -2,631.42 mL [04/25/20 1658]  CONSTITUTIONAL: Appears older than stated age, hemodynamically stable,  No acute distress.  SKIN: Skin is warm and dry. No rash noted. No cyanosis. No pallor. No jaundice.  Tattoo noted over the anterior chest wall HEAD: Normocephalic and atraumatic.  EYES: No scleral icterus MOUTH/THROAT: Moist oral membranes.  NECK: No JVD present. No thyromegaly noted. LYMPHATIC: No visible cervical adenopathy.  CHEST Normal respiratory effort. No intercostal retractions  LUNGS: Clear to auscultation bilaterally.  No stridor. No wheezes. No rales.  CARDIOVASCULAR: Regular, positive S1-S2, soft systolic murmur heard at the second intercostal space, no gallops or rubs ABDOMINAL: Mildly distended, nontender, positive bowel sounds in all 4 quadrants, soft no apparent ascites.  EXTREMITIES: No peripheral edema, 2+ dorsalis pedis and posterior tibial pulses bilaterally. HEMATOLOGIC: No significant bruising NEUROLOGIC: Oriented to person, place, and time. Nonfocal. Normal muscle tone.  PSYCHIATRIC: Normal mood and affect. Normal behavior. Cooperative  RADIOLOGY: No results found.  LABORATORY DATA: Lab Results  Component Value Date   WBC 15.2 (H) 04/25/2020   HGB 7.1 (L) 04/25/2020   HCT 20.9 (L)  04/25/2020   MCV 92.9 04/25/2020   PLT 210 04/25/2020    Recent Labs  Lab 04/25/20 0600  NA 134*  K 3.8  CL 101  CO2 24  BUN 7  CREATININE 0.85  CALCIUM 7.8*  PROT 4.9*  BILITOT 0.3  ALKPHOS 111  ALT 15  AST 31  GLUCOSE 111*    Lipid Panel  No results found for: CHOL, TRIG, HDL, CHOLHDL, VLDL, LDLCALC  BNP (last 3 results) No results for input(s): BNP in the last 8760 hours.  HEMOGLOBIN A1C Lab Results  Component Value Date   HGBA1C 5.5 12/22/2019    Cardiac Panel (last 3 results) No results for input(s): CKTOTAL, CKMB, RELINDX in the last 8760 hours.  Invalid input(s): TROPONINHS  No results found for: CKTOTAL, CKMB, CKMBINDEX   TSH No results for input(s): TSH in the last 8760 hours.    CARDIAC DATABASE: EKG: 04/25/2020: Normal sinus rhythm, 91 bpm, normal axis, RSR prime pattern leads V1 through V3.  Without underlying injury pattern.  Echocardiogram: Pending  IMPRESSION & RECOMMENDATIONS: Sylvia Pennington is a 30 y.o. African-American female whose past medical history and cardiovascular risk factors include: History of syncope, anxiety, cocaine use, active smoker, alcohol consumption.  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  She has history of syncopal events and had plans to be evaluated cardiac electrophysiology but had left AMA during prior hospitalizations. Primary team requesting reevaluation as patient is a poor candidate for outpatient follow-up. I will consult cardiac electrophysiologist Dr. Elberta Fortis for further evaluation for possible  type II Brugada pattern for evaluation and management.  Systolic murmur: May be flow murmur secondary to increased plasma volume due to recent pregnancy.  However, echocardiogram will be ordered to evaluate for structural heart disease and left ventricular systolic function.  History of recurrent syncopes: Last episode prior to 2019.   See above  Preeclampsia: I suspect that her underlying high blood pressure is possibly secondary to her cocaine use. Currently on Procardia, continue to titrate as needed with a goal systolic blood pressure of around 120-130 mmHg. Avoid beta-blocker therapy given her cocaine use.  Cocaine use: Educated on importance of complete cessation of alcohol and illicit drug use.  Active smoker: Currently smokes less than 1 pack/day.  Educated on the importance of complete smoking cessation.  Noncompliance  Total encounter time 85 minutes. Total Encounter Time as defined by the Centers for Medicare and Medicaid Services includes, in addition to the face-to-face time of a patient visit (documented in the note above) non-face-to-face time: obtaining and reviewing outside history, ordering and reviewing medications, tests or procedures, care coordination (communications with other health care professionals or caregivers) and documentation in the medical record.  Patient's questions and concerns were addressed to her satisfaction. She voices understanding of the instructions provided during this encounter.   This note was created using a voice recognition software as a result there may be grammatical errors inadvertently enclosed that do not reflect the nature of this encounter. Every attempt is made to correct such errors.  Delilah Shan [facility]  Pager: [phone] Office: [phone] 04/25/2020, 4:58 PM

## 2020-04-25 NOTE — Anesthesia Postprocedure Evaluation (Signed)
Anesthesia Post Note  Patient: Sylvia Pennington  Procedure(s) Performed: AN AD HOC LABOR EPIDURAL     Patient location during evaluation: Mother Baby Anesthesia Type: Epidural Level of consciousness: awake and alert and oriented Pain management: satisfactory to patient Vital Signs Assessment: post-procedure vital signs reviewed and stable Respiratory status: respiratory function stable Cardiovascular status: stable Postop Assessment: no headache, no backache, epidural receding, patient able to bend at knees, no signs of nausea or vomiting, adequate PO intake and able to ambulate Anesthetic complications: no   No complications documented.  Last Vitals:  Vitals:   04/24/20 2255 04/25/20 0515  BP: (!) 146/61 132/75  Pulse: 88 89  Resp: 18 18  Temp: 36.6 C 36.6 C  SpO2: 98% 98%    Last Pain:  Vitals:   04/25/20 0515  TempSrc: Oral  PainSc:    Pain Goal:                   Chieko Neises

## 2020-04-26 ENCOUNTER — Other Ambulatory Visit (HOSPITAL_COMMUNITY): Payer: Self-pay | Admitting: Obstetrics and Gynecology

## 2020-04-26 ENCOUNTER — Inpatient Hospital Stay (HOSPITAL_COMMUNITY): Payer: Medicaid Other

## 2020-04-26 DIAGNOSIS — O9833 Other infections with a predominantly sexual mode of transmission complicating the puerperium: Secondary | ICD-10-CM

## 2020-04-26 DIAGNOSIS — Z87898 Personal history of other specified conditions: Secondary | ICD-10-CM

## 2020-04-26 DIAGNOSIS — A599 Trichomoniasis, unspecified: Secondary | ICD-10-CM

## 2020-04-26 DIAGNOSIS — R9431 Abnormal electrocardiogram [ECG] [EKG]: Secondary | ICD-10-CM

## 2020-04-26 LAB — ECHOCARDIOGRAM COMPLETE
Height: 66 in
S' Lateral: 2.9 cm
Weight: 2948.87 oz

## 2020-04-26 MED ORDER — ACETAMINOPHEN 325 MG PO TABS: 650.0000 mg | ORAL_TABLET | Freq: Four times a day (QID) | ORAL | Status: AC | PRN

## 2020-04-26 MED ORDER — FERROUS SULFATE 325 (65 FE) MG PO TBEC: 325.0000 mg | DELAYED_RELEASE_TABLET | ORAL | 0 refills | Status: AC

## 2020-04-26 MED ORDER — IBUPROFEN 100 MG/5ML PO SUSP
600.0000 mg | Freq: Four times a day (QID) | ORAL | 1 refills | Status: DC | PRN
Start: 2020-04-26 — End: 2020-04-26

## 2020-04-26 MED ORDER — NIFEDIPINE ER 30 MG PO TB24
30.0000 mg | ORAL_TABLET | Freq: Every day | ORAL | 0 refills | Status: DC
Start: 2020-04-27 — End: 2020-05-03

## 2020-04-26 MED ORDER — IBUPROFEN 600 MG PO TABS: 600.0000 mg | ORAL_TABLET | Freq: Four times a day (QID) | ORAL | 1 refills | Status: DC | PRN

## 2020-04-26 MED FILL — NIFEdipine ER 30 MG TB24: 30 | 42 days supply | Qty: 42 | Fill #0

## 2020-04-26 MED FILL — IBUPROFEN 600 MG TABLET: 600 | 7 days supply | Qty: 30 | Fill #0

## 2020-04-26 NOTE — Clinical Social Work Maternal (Signed)
CLINICAL SOCIAL WORK MATERNAL/CHILD NOTE  Patient Details  Name: Sylvia Pennington MRN: [id number] Date of Birth: 09/24/1990  Date:  04/26/2020  Clinical Social Worker Initiating Note:  Abra Lingenfelter, LCSW Date/Time: Initiated:  04/26/20/0930     Child's Name:  Massad Smith   Biological Parents:  Mother, Father (Father: Elvis Smith)   Need for Interpreter:  None   Reason for Referral:  Current Substance Use/Substance Use During Pregnancy , Late or No Prenatal Care    Address:  [address] [place], [place] [address]   Phone number:  [phone] (home)     Additional phone number:   Household Members/Support Persons (HM/SP):   Household Member/Support Person 1   HM/SP Name Relationship DOB or Age  HM/SP -1 Elvis Smith FOB 03/09/1958  HM/SP -2        HM/SP -3        HM/SP -4        HM/SP -5        HM/SP -6        HM/SP -7        HM/SP -8          Natural Supports (not living in the home):  Parent, Immediate Family   Professional Supports: None   Employment: Unemployed   Type of Work:     Education:  Some College   Homebound arranged:    Financial Resources:  Medicaid   Other Resources:  WIC, Food Stamps    Cultural/Religious Considerations Which May Impact Care:    Strengths:  Ability to meet basic needs , Home prepared for child    Psychotropic Medications:         Pediatrician:     MOB reported that a pediatrician was selected but could not recall the name. MOB reported that she will contact her sister to get the name of the pediatrician.   Pediatrician List:   [place]    [place]    [place]    [place]    [place]    [place]      Pediatrician Fax Number:    Risk Factors/Current Problems:  Substance Use    Cognitive State:  Able to Concentrate , Alert , Linear Thinking , Insightful , Goal Oriented    Mood/Affect:  Calm , Happy , Comfortable , Interested    CSW Assessment: CSW received  consult for late and limited PNC.  CSW reviewed chart and MOB started care prior to 28 weeks and had more than 2 visits. CSW did not address consult reason with MOB as it does not meet the criteria. CSW completed assessment for substance use during pregnancy.   CSW met with MOB at bedside to discuss substance use during pregnancy, FOB present. CSW introduced self and asked to speak with MOB privately, FOB reported that he was leaving and left the room. CSW explained reason for visit. MOB was welcoming, pleasant and remained engaged during assessment. MOB reported that her address listed is her moms home but she plans to stay with FOB after discharge. MOB provided FOB's address which is listed above. MOB reported that she receives both WIC and food stamps. MOB reported that she has all items needed to care for infant including a car seat and crib. CSW inquired about MOB's support system, MOB reported that FOB, her sisters and mom are supports.   CSW inquired about MOB's mental health history. MOB denied any mental health history. CSW inquired about how MOB was feeling   emotionally after giving birth, MOB reported that she was feeling good and happy. MOB presented calm and did not demonstrate any acute mental health signs/symptoms. CSW assessed for safety, MOB denied SI, HI and domestic violence.   CSW provided education regarding the baby blues period vs. perinatal mood disorders, discussed treatment and gave resources for mental health follow up if concerns arise.  CSW recommends self-evaluation during the postpartum time period using the New Mom Checklist from Postpartum Progress and encouraged MOB to contact a medical professional if symptoms are noted at any time.    CSW provided review of Sudden Infant Death Syndrome (SIDS) precautions.    CSW informed MOB about the hospital drug screen policy due to substance use during pregnancy. MOB reported that her heart doctor told her that infant's UDS was  positive for cocaine. MOB reported that she went to a party a month ago and was recreational partying and did cocaine one time. MOB denied any other substance use during pregnancy. CSW asked if MOB was interested in substance use treatment resources, MOB declined resources. CSW informed MOB that a CPS report was made due to infant's positive UDS for cocaine and that CPS would follow up with MOB at the hospital. MOB verbalized understanding and denied any questions.   Currently there are barriers to infant discharging to MOB. Awaiting visit from CPS and infant's discharge plan.     CSW Plan/Description:  Psychosocial Support and Ongoing Assessment of Needs, Sudden Infant Death Syndrome (SIDS) Education, Perinatal Mood and Anxiety Disorder (PMADs) Education, Hospital Drug Screen Policy Information, Child Protective Service Report , CSW Awaiting CPS Disposition Plan, CSW Will Continue to Monitor Umbilical Cord Tissue Drug Screen Results and Make Report if Warranted    Uriah Trueba L Amand Lemoine, LCSW 04/26/2020, 11:16 AM  

## 2020-04-26 NOTE — Progress Notes (Signed)
[place] [employer] CPS social workers (Stephanie Wainwright and Chance Franks) came to visit with MOB.  ° °CPS social worker (Stephanie Wainwright) reported that there are barriers to infant discharging home to MOB and that she is working on infant's discharge plan. RN updated. CPS social worker agreed to contact CSW with an update.  ° °Currently there are barriers to infant discharging to MOB. Infant's discharge plan is undecided at this time.  ° °Sylvia Buckman, LCSW °Clinical Social Worker °[facility] °Cell#: [phone] ° °

## 2020-04-26 NOTE — Progress Notes (Addendum)
Daily Postpartum Note  Admission Date: 04/24/2020 Current Date: 04/26/2020 11:40 AM  Sylvia Pennington is a 29 y.o. G2P1011 PPD#2 SVD/2nd degree laceration @ 38wks. , admitted for active labor. Patient diagnosed with severe pre-eclampsia, based on BPs, on admission  Pregnancy complicated by: Patient Active Problem List   Diagnosis Date Noted  . Cocaine use disorder ([place]) 04/25/2020  . Anemia associated with acute blood loss 04/25/2020  . Postpartum hemorrhage 04/25/2020  . Postpartum anemia 04/25/2020  . Abnormal EKG   . Systolic murmur   . History of syncope   . Preeclampsia, severe, third trimester   . Smoking   . Noncompliance   . Shoulder dystocia during labor and delivery 04/24/2020  . Trichomonas infection 01/05/2020  . Arrhythmia 01/01/2020  . GBS bacteriuria 12/27/2019  . Rubella non-immune status, antepartum 12/27/2019  . Supervision of other normal pregnancy, antepartum 12/22/2019  . Late prenatal care affecting pregnancy, antepartum 12/22/2019  . Syncope 01/14/2018    Overnight/24hr events:  none  Subjective:  No s/s of pre-eclampsia  Objective:    Current Vital Signs 24h Vital Sign Ranges  T 98.1 F (36.7 C) Temp  Avg: 98.2 F (36.8 C)  Min: 98.1 F (36.7 C)  Max: 98.2 F (36.8 C)  BP (!) 152/78 BP  Min: 138/74  Max: 156/83  HR 85 Pulse  Avg: 88.7  Min: 85  Max: 99  RR 18 Resp  Avg: 18  Min: 16  Max: 20  SaO2 100 % Room Air SpO2  Avg: 100 %  Min: 100 %  Max: 100 %       24 Hour I/O Current Shift I/O  Time Ins Outs 11/14 0701 - 11/15 0700 In: 3877.8 [P.O.:2640; I.V.:1237.8] Out: 4800 [Urine:4800] 11/15 0701 - 11/15 1900 In: 462 [P.O.:462] Out: 1000 [Urine:1000]   Patient Vitals for the past 24 hrs:  BP Temp Temp src Pulse Resp SpO2  04/26/20 0813 (!) 152/78 98.1 F (36.7 C) Oral -- 18 100 %  04/26/20 0414 138/74 98.1 F (36.7 C) Oral 85 16 100 %  04/25/20 2338 138/75 -- -- 85 -- --  04/25/20 2337 138/75 98.2 F (36.8 C) Oral 87 18 100 %   04/25/20 1957 (!) 146/80 98.2 F (36.8 C) Oral 86 20 100 %  04/25/20 1558 (!) 156/83 98.2 F (36.8 C) Oral 90 18 100 %  04/25/20 1143 (!) 148/69 -- -- 99 -- --    Physical exam: General: Well nourished, well developed female in no acute distress. Abdomen: firm fundus below the umbilicus, nttp Cardiovascular: S1, S2 normal, no murmur, rub or gallop, regular rate and rhythm Respiratory: CTAB Extremities: no clubbing, cyanosis or edema Skin: Warm and dry.   Medications: Current Facility-Administered Medications  Medication Dose Route Frequency Provider Last Rate Last Admin  . acetaminophen (TYLENOL) tablet 650 mg  650 mg Oral Q4H PRN Ezequiel Essex, MD      . benzocaine-Menthol (DERMOPLAST) 20-0.5 % topical spray 1 application  1 application Topical PRN Renee Harder, CNM   1 application at 44/31/54 1450  . coconut oil  1 application Topical PRN Renee Harder, CNM      . witch hazel-glycerin (TUCKS) pad 1 application  1 application Topical PRN Simpson, Danielle L, CNM       And  . dibucaine (NUPERCAINAL) 1 % rectal ointment 1 application  1 application Rectal PRN Renee Harder, CNM      . diphenhydrAMINE (BENADRYL) capsule 25 mg  25 mg Oral Q6H PRN  Renee Harder, CNM      . fentaNYL (SUBLIMAZE) injection 50-100 mcg  50-100 mcg Intravenous Q1H PRN Ezequiel Essex, MD   100 mcg at 04/24/20 0447  . ibuprofen (ADVIL) 100 MG/5ML suspension 600 mg  600 mg Oral Q6H Anyanwu, Ugonna A, MD   600 mg at 04/26/20 0514  . NIFEdipine (PROCARDIA-XL/NIFEDICAL-XL) 24 hr tablet 30 mg  30 mg Oral Daily Anyanwu, Ugonna A, MD   30 mg at 04/26/20 0949  . ondansetron (ZOFRAN) injection 4 mg  4 mg Intravenous Q6H PRN Ezequiel Essex, MD      . ondansetron [facility]) tablet 4 mg  4 mg Oral Q4H PRN Simpson, Danielle L, CNM       Or  . ondansetron (ZOFRAN) injection 4 mg  4 mg Intravenous Q4H PRN Renee Harder, CNM      . oxyCODONE-acetaminophen (PERCOCET/ROXICET) 5-325 MG per tablet  1 tablet  1 tablet Oral Q4H PRN Ezequiel Essex, MD      . oxyCODONE-acetaminophen (PERCOCET/ROXICET) 5-325 MG per tablet 2 tablet  2 tablet Oral Q4H PRN Ezequiel Essex, MD      . oxytocin (PITOCIN) IV infusion 30 units in NS 500 mL - Premix  2.5 Units/hr Intravenous Continuous Ezequiel Essex, MD   Stopped at 04/24/20 1830  . prenatal multivitamin tablet 1 tablet  1 tablet Oral Q1200 Simpson, Danielle L, CNM      . senna-docusate (Senokot-S) tablet 2 tablet  2 tablet Oral Q24H Renee Harder, CNM   2 tablet at 04/25/20 2307  . simethicone (MYLICON) chewable tablet 80 mg  80 mg Oral PRN Renee Harder, CNM      . sodium citrate-citric acid (ORACIT) solution 30 mL  30 mL Oral Q2H PRN Ezequiel Essex, MD      . sodium phosphate (FLEET) 7-19 GM/118ML enema 1 enema  1 enema Rectal PRN Ezequiel Essex, MD      . Tdap (BOOSTRIX) injection 0.5 mL  0.5 mL Intramuscular Once Renee Harder, CNM      . zolpidem (AMBIEN) tablet 5 mg  5 mg Oral QHS PRN Renee Harder, CNM        Labs:  Recent Labs  Lab 04/24/20 0359 04/24/20 1634 04/25/20 0600  WBC 8.8 13.4* 15.2*  HGB 9.4* 10.1* 7.1*  HCT 29.0* 31.7* 20.9*  PLT 242 263 210    Recent Labs  Lab 04/24/20 0425 04/25/20 0600  NA 136 134*  K 3.7 3.8  CL 104 101  CO2 21* 24  BUN 7 7  CREATININE 0.73 0.85  CALCIUM 9.2 7.8*  PROT 6.8 4.9*  BILITOT 0.6 0.3  ALKPHOS 151* 111  ALT 17 15  AST 33 31  GLUCOSE 85 111*     Radiology:  No new imaging  Assessment & Plan:  Pt stable *Pregnancy: formula feeding. Pt declines birth control. No circumcision. A POS. S/p MMR *CV: signed off by cardiology and plans for outpatient additional monitoring made *Severe pre-x: continue procardia xl *SW: follow up CPS *PPx: SCDs, OOB ad lib after the Mg *FEN/GI: regular diet, MIVF with Mg *Dispo: likely later today if BP is still acceptable. Request sent for 1wk BP check with OB clinic  Durene Romans MD Attending Center for  [facility]) GYN Consult Phone: [phone] (M-F, [phone]) & [phone] (Off hours, weekends, holidays)

## 2020-04-26 NOTE — Progress Notes (Signed)
Echocardiogram 2D Echocardiogram has been performed.  Sylvia Pennington Sylvia Pennington 04/26/2020, 8:26 AM

## 2020-04-26 NOTE — Plan of Care (Signed)
Problem: Education: Goal: Knowledge of General Education information will improve Description: Including pain rating scale, medication(s)/side effects and non-pharmacologic comfort measures Outcome: Adequate for Discharge   Problem: Health Behavior/Discharge Planning: Goal: Ability to manage health-related needs will improve Outcome: Adequate for Discharge   Problem: Clinical Measurements: Goal: Ability to maintain clinical measurements within normal limits will improve Outcome: Adequate for Discharge Goal: Will remain free from infection Outcome: Adequate for Discharge Goal: Diagnostic test results will improve Outcome: Adequate for Discharge Goal: Respiratory complications will improve Outcome: Adequate for Discharge Goal: Cardiovascular complication will be avoided Outcome: Adequate for Discharge   Problem: Activity: Goal: Risk for activity intolerance will decrease Outcome: Adequate for Discharge   Problem: Nutrition: Goal: Adequate nutrition will be maintained Outcome: Adequate for Discharge   Problem: Coping: Goal: Level of anxiety will decrease Outcome: Adequate for Discharge   Problem: Elimination: Goal: Will not experience complications related to bowel motility Outcome: Adequate for Discharge Goal: Will not experience complications related to urinary retention Outcome: Adequate for Discharge   Problem: Pain Managment: Goal: General experience of comfort will improve Outcome: Adequate for Discharge   Problem: Safety: Goal: Ability to remain free from injury will improve Outcome: Adequate for Discharge   Problem: Skin Integrity: Goal: Risk for impaired skin integrity will decrease Outcome: Adequate for Discharge   Problem: Education: Goal: Knowledge of disease or condition will improve Outcome: Adequate for Discharge Goal: Knowledge of the prescribed therapeutic regimen will improve Outcome: Adequate for Discharge   Problem: Fluid Volume: Goal:  Peripheral tissue perfusion will improve Outcome: Adequate for Discharge   Problem: Clinical Measurements: Goal: Complications related to disease process, condition or treatment will be avoided or minimized Outcome: Adequate for Discharge   Problem: Education: Goal: Knowledge of General Education information will improve Description: Including pain rating scale, medication(s)/side effects and non-pharmacologic comfort measures Outcome: Adequate for Discharge   Problem: Health Behavior/Discharge Planning: Goal: Ability to manage health-related needs will improve Outcome: Adequate for Discharge   Problem: Clinical Measurements: Goal: Ability to maintain clinical measurements within normal limits will improve Outcome: Adequate for Discharge Goal: Will remain free from infection Outcome: Adequate for Discharge Goal: Diagnostic test results will improve Outcome: Adequate for Discharge Goal: Respiratory complications will improve Outcome: Adequate for Discharge Goal: Cardiovascular complication will be avoided Outcome: Adequate for Discharge   Problem: Activity: Goal: Risk for activity intolerance will decrease Outcome: Adequate for Discharge   Problem: Nutrition: Goal: Adequate nutrition will be maintained Outcome: Adequate for Discharge   Problem: Coping: Goal: Level of anxiety will decrease Outcome: Adequate for Discharge   Problem: Elimination: Goal: Will not experience complications related to bowel motility Outcome: Adequate for Discharge Goal: Will not experience complications related to urinary retention Outcome: Adequate for Discharge   Problem: Pain Managment: Goal: General experience of comfort will improve Outcome: Adequate for Discharge   Problem: Safety: Goal: Ability to remain free from injury will improve Outcome: Adequate for Discharge   Problem: Skin Integrity: Goal: Risk for impaired skin integrity will decrease Outcome: Adequate for Discharge    Problem: Education: Goal: Knowledge of condition will improve Outcome: Adequate for Discharge Goal: Individualized Educational Video(s) Outcome: Adequate for Discharge Goal: Individualized Newborn Educational Video(s) Outcome: Adequate for Discharge   Problem: Activity: Goal: Will verbalize the importance of balancing activity with adequate rest periods Outcome: Adequate for Discharge Goal: Ability to tolerate increased activity will improve Outcome: Adequate for Discharge   Problem: Coping: Goal: Ability to identify and utilize available resources and services will improve  Outcome: Adequate for Discharge   Problem: Life Cycle: Goal: Chance of risk for complications during the postpartum period will decrease Outcome: Adequate for Discharge   Problem: Role Relationship: Goal: Ability to demonstrate positive interaction with newborn will improve Outcome: Adequate for Discharge   Problem: Skin Integrity: Goal: Demonstration of wound healing without infection will improve Outcome: Adequate for Discharge

## 2020-04-26 NOTE — Consult Note (Addendum)
ELECTROPHYSIOLOGY CONSULT NOTE    Patient ID: Sylvia Pennington MRN: [id number], DOB/AGE: [birth year] 29 y.o.  Admit date: 04/24/2020 Date of Consult: 04/26/2020  Primary Physician: Patient, No Pcp Per Primary Cardiologist: No primary care provider on file.  Electrophysiologist: New  Referring Provider: Dr. Odis Hollingshead  Patient Profile: Sylvia Pennington is a 29 y.o. female with a history of syncope, anxiety, cocaine abuse, tobacco abuse, and alcohol abuse who is being seen today for the evaluation of abnormal EKG and h/o syncope at the request of Dr. Odis Hollingshead.  HPI:  Sylvia Pennington is a 29 y.o. female with medical history as above who presented in active labor 04/24/2020. Her estimated date of deliver was 05/05/20.  She was noted to have severe preeclampsia and abnormal EKG and cardiology was consulted. Started on procardia by OB/GYN.   Pt was previously seen by cardiology in 01/2018 after coming to the hospital due to syncope. She had recurrent syncopal events in the past and EKG at that time was concerning for ? brugada pattern. She was referred to EP as outpatient but left hospital AMA and failed to follow up. She was referred to cardiology during this pregnancy but failed to follow up.   Review of notes from that time state h/o of syncope going back 5 years, and occurring 1-2 times a year. She reported they were more frequent over the 1-2 years leading up to 01/2018, occurring every month or so.  She denied any symptoms (or memory of symptoms) prior to these events, and no memory of what had occurred after the event.  She reported family history of a cousin who died suddenly at his workplace in his 34s. She was briefly discussed with EP at that time and admitted for possible flecainide challenge. She left AMA that evening.   During this pregnancy she reports use of cigarettes, as well as alcohol and cocaine (last estimated use 1 month ago)  Systolic murmur noted on exam. Echo pending (performed this  am)  She is resting currently. Denies any symptoms. She denies history of syncope in the > year. Had near syncope at [employer] approx a year ago while waiting in line. Feels like she's "losing control of herself" when it happens. She can hear people asking if she is ok and to sit up, but isn't able to comply or answer. She estimates approx 3-4 syncope in her lifetime despite previous history above. She confirms her cousin at sudden death in his 94s, but unclear circumstances. She has 6 sisters and 1 brother. No immediate family with unexpected sudden cardiac death. She denies dizziness, lightheadedness, syncope, or chest pain. No loss of bowel or bladder with her syncopes in the past. She is unsure how long she is "out" for.   Past Medical History:  Diagnosis Date  . Anxiety      Surgical History: History reviewed. No pertinent surgical history.   Medications Prior to Admission  Medication Sig Dispense Refill Last Dose  . Blood Pressure Monitoring (BLOOD PRESSURE MONITOR AUTOMAT) DEVI 1 Device by Does not apply route daily. Automatic blood pressure cuff regular size. To monitor blood pressure regularly at home. ICD-10 code:Z34.90 1 each 0   . docusate sodium (COLACE) 100 MG capsule Take 1 capsule (100 mg total) by mouth 2 (two) times daily. 60 capsule 3   . ferrous sulfate 325 (65 FE) MG EC tablet Take 1 tablet (325 mg total) by mouth every other day. 30 tablet 2   . Misc. Devices (GOJJI WEIGHT SCALE) MISC  1 Device by Does not apply route daily as needed. To weight self daily as needed at home. ICD-10 code: Z34.90 1 each 0   . Prenatal Vit-Fe Phos-FA-Omega (VITAFOL GUMMIES) 3.33-0.333-34.8 MG CHEW Chew 3 each by mouth daily. 90 tablet 12     Inpatient Medications:  . ibuprofen  600 mg Oral Q6H  . NIFEdipine  30 mg Oral Daily  . prenatal multivitamin  1 tablet Oral Q1200  . senna-docusate  2 tablet Oral Q24H  . Tdap  0.5 mL Intramuscular Once    Allergies: No Known Allergies  Social  History   Socioeconomic History  . Marital status: Married    Spouse name: Not on file  . Number of children: Not on file  . Years of education: Not on file  . Highest education level: Associate degree: occupational, Scientist, product/process development, or vocational program  Occupational History  . Not on file  Tobacco Use  . Smoking status: Current Every Day Smoker    Packs/day: 0.25    Years: 0.00    Pack years: 0.00  . Smokeless tobacco: Never Used  . Tobacco comment: 1-2 cig/day  Vaping Use  . Vaping Use: Never used  Substance and Sexual Activity  . Alcohol use: Not Currently  . Drug use: Not Currently    Types: Marijuana    Comment: states it has been a month  . Sexual activity: Yes    Birth control/protection: None  Other Topics Concern  . Not on file  Social History Narrative   Late to care   Social Determinants of Health   Financial Resource Strain: Low Risk   . Difficulty of Paying Living Expenses: Not very hard  Food Insecurity: No Food Insecurity  . Worried About Programme researcher, broadcasting/film/video in the Last Year: Never true  . Ran Out of Food in the Last Year: Never true  Transportation Needs: No Transportation Needs  . Lack of Transportation (Medical): No  . Lack of Transportation (Non-Medical): No  Physical Activity: Insufficiently Active  . Days of Exercise per Week: 3 days  . Minutes of Exercise per Session: 30 min  Stress: No Stress Concern Present  . Feeling of Stress : Only a little  Social Connections:   . Frequency of Communication with Friends and Family: Not on file  . Frequency of Social Gatherings with Friends and Family: Not on file  . Attends Religious Services: Not on file  . Active Member of Clubs or Organizations: Not on file  . Attends Banker Meetings: Not on file  . Marital Status: Not on file  Intimate Partner Violence: Unknown  . Fear of Current or Ex-Partner: No  . Emotionally Abused: No  . Physically Abused: No  . Sexually Abused: Not on file      Family History  Problem Relation Age of Onset  . Sudden Cardiac Death Cousin   . Diabetes Mother      Review of Systems: All other systems reviewed and are otherwise negative except as noted above.  Physical Exam: Vitals:   04/25/20 2337 04/25/20 2338 04/26/20 0414 04/26/20 0813  BP: 138/75 138/75 138/74 (!) 152/78  Pulse: 87 85 85   Resp: 18  16 18   Temp: 98.2 F (36.8 C)  98.1 F (36.7 C) 98.1 F (36.7 C)  TempSrc: Oral  Oral Oral  SpO2: 100%  100% 100%  Weight:      Height:        GEN- The patient is well appearing, alert and  oriented x 3 today.   HEENT: normocephalic, atraumatic; sclera clear, conjunctiva pink; hearing intact; oropharynx clear; neck supple Lungs- Clear to ausculation bilaterally, normal work of breathing.  No wheezes, rales, rhonchi Heart- Regular rate and rhythm, no murmurs, rubs or gallops GI- soft, non-tender, non-distended, bowel sounds present Extremities- no clubbing, cyanosis, or edema; DP/PT/radial pulses 2+ bilaterally MS- no significant deformity or atrophy Skin- warm and dry, no rash or lesion Psych- euthymic mood, full affect Neuro- strength and sensation are intact  Labs:   Lab Results  Component Value Date   WBC 15.2 (H) May 24, 2020   HGB 7.1 (L) May 24, 2020   HCT 20.9 (L) 2020/05/24   MCV 92.9 2020-05-24   PLT 210 05/24/2020    Recent Labs  Lab 05-24-2020 0600  NA 134*  K 3.8  CL 101  CO2 24  BUN 7  CREATININE 0.85  CALCIUM 7.8*  PROT 4.9*  BILITOT 0.3  ALKPHOS 111  ALT 15  AST 31  GLUCOSE 111*     Radiology/Studies: No results found.  EKG: 2020/05/24 shows NSR at 91 bpm with RSR' in V1-V3. PR interval 166 ms, and QRS 116 ms (personally reviewed)  EKG 01/14/2018 showed sinus bradycardia at 57 bpm, RSR' or QR pattern in V1 and V2. ? V3. PR interval 150 ms, QRS 124 ms.  TELEMETRY: Not connected  Assessment/Plan: 1.  H/o Syncope and abnormal EKG 05-24-2020 shows NSR at 91 bpm with RSR' in V1-V3. PR interval 166  ms, and QRS 116 ms (personally reviewed) EKG 01/14/2018 showed sinus bradycardia at 57 bpm, RSR' or QR pattern in V1 and V2. ? V3. PR interval 150 ms, QRS 124 ms She denies recurrent syncope since that admission.  MD to review EKGs for plan. Echo may also further clarify.   2. Systolic murmur Echo pending  3. Pre-eclampsia BP in 130-150s now on Procardia  4. Substance abuse Cocaine, alcohol, and tobacco use noted during pregnancy. Smokes ~0.25 ppd.   Dr. Ladona Ridgel has seen and discussed loop monitoring. We will schedule an outpatient visit to further discuss; pending compliance with follow up and insurance.   For questions or updates, please contact [facility] Please consult [email or web address] for contact info under Cardiology/STEMI.  Dustin Flock, PA-C  04/26/2020 9:20 AM  EP Attending  Patient seen and examined. Agree with above. The patient is referred for evaluation of an abnormal ECG. She has a h/o polysubstance abuse and just delivered a baby boy. She carries a remote h/o syncope and when questioned does not have palpitations. She notes that when the spells occur she feels like she is losing control of her herself. There is no h/o sudden death. She tells me that family members have died young but not unexpectantly. Her Exam is as noted above. Her echo has been reviewed and is normal.  A/P 1. 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 I doubt that her risk of an arrhythmic event is low but not zero. 2. Poly substance abuse - she is encouraged to stop using cocaine. I would not use a beta blocker with cocaine use. Sharlot Gowda Devaughn Savant,MD

## 2020-04-27 ENCOUNTER — Telehealth: Payer: Self-pay

## 2020-04-27 LAB — SURGICAL PATHOLOGY

## 2020-04-27 NOTE — Telephone Encounter (Signed)
Transition Care Management Unsuccessful Follow-up Telephone Call  Date of discharge and from where:  11/15/2021[facility]  Attempts:  1st Attempt  Reason for unsuccessful TCM follow-up call:  Left voice message

## 2020-04-28 ENCOUNTER — Telehealth: Payer: Self-pay | Admitting: Family Medicine

## 2020-04-28 NOTE — Telephone Encounter (Signed)
Mailbox full, and I was not able to leave a message.

## 2020-04-28 NOTE — Telephone Encounter (Signed)
Transition Care Management Unsuccessful Follow-up Telephone Call  Date of discharge and from where:  11/15/2021[facility]  Attempts:  2nd Attempt  Reason for unsuccessful TCM follow-up call:  Voice mail full

## 2020-04-30 ENCOUNTER — Ambulatory Visit: Payer: Medicaid Other

## 2020-04-30 NOTE — Telephone Encounter (Signed)
Transition Care Management Unsuccessful Follow-up Telephone Call  Date of discharge and from where:  11/15/2021[facility]  Attempts:  3rd Attempt  Reason for unsuccessful TCM follow-up call:  Voice mail full

## 2020-05-03 ENCOUNTER — Ambulatory Visit (INDEPENDENT_AMBULATORY_CARE_PROVIDER_SITE_OTHER): Payer: Medicaid Other | Admitting: *Deleted

## 2020-05-03 ENCOUNTER — Encounter: Payer: Self-pay | Admitting: *Deleted

## 2020-05-03 ENCOUNTER — Other Ambulatory Visit: Payer: Self-pay

## 2020-05-03 VITALS — BP 160/83 | HR 93 | Ht 65.0 in | Wt 155.8 lb

## 2020-05-03 DIAGNOSIS — Z0130 Encounter for examination of blood pressure without abnormal findings: Secondary | ICD-10-CM

## 2020-05-03 MED ORDER — NIFEDIPINE ER 30 MG PO TB24: 60.0000 mg | ORAL_TABLET | Freq: Every day | ORAL | 0 refills | Status: AC

## 2020-05-03 NOTE — Progress Notes (Signed)
Pt presents for BP check following vaginal delivery on 11/13.  BP - 160/83, P - 93. Pt denies H/A or visual disturbances. She is still experiencing some perineal discomfort due to lacerations healing. Pt was encouraged to use topical anesthetic spray provided @ hospital 3 times daily. Per Dr. Debroah Loop, pt was instructed to increase to Nifedipine 60 mg XL daily. Pt voiced understanding of all information and instructions given. PP appt is scheduled on 12/22.

## 2020-05-18 ENCOUNTER — Ambulatory Visit: Payer: Medicaid Other | Admitting: Internal Medicine

## 2020-06-02 ENCOUNTER — Ambulatory Visit: Payer: Medicaid Other | Admitting: Family Medicine

## 2021-01-18 IMAGING — US US MFM OB DETAIL+14 WK
1 series · 13 of 28 positions shown · non-contrast
Comparison: none

[Series 1: us mfm ob detail+14 wk · 13 of 92 slices shown]
[im 4/92]
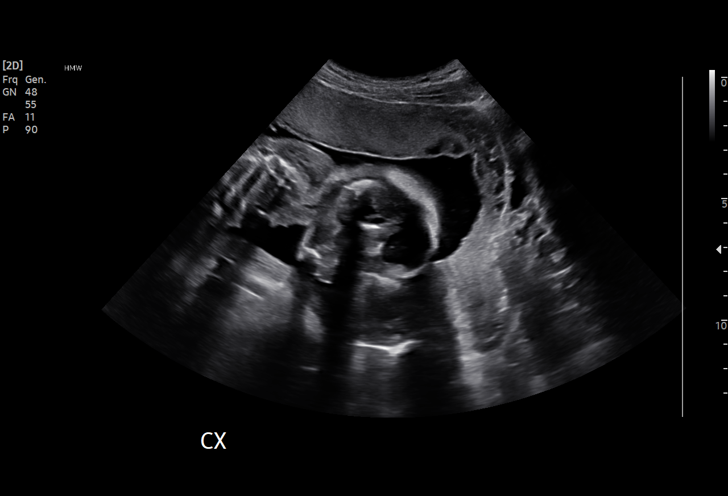
[im 11/92]
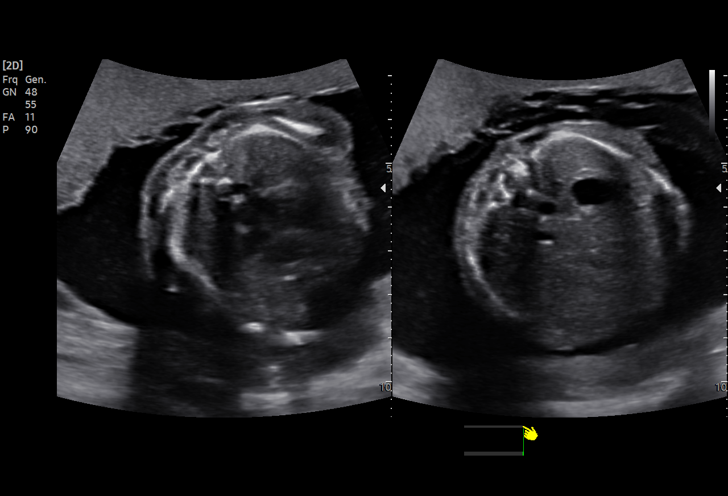
[im 17/92]
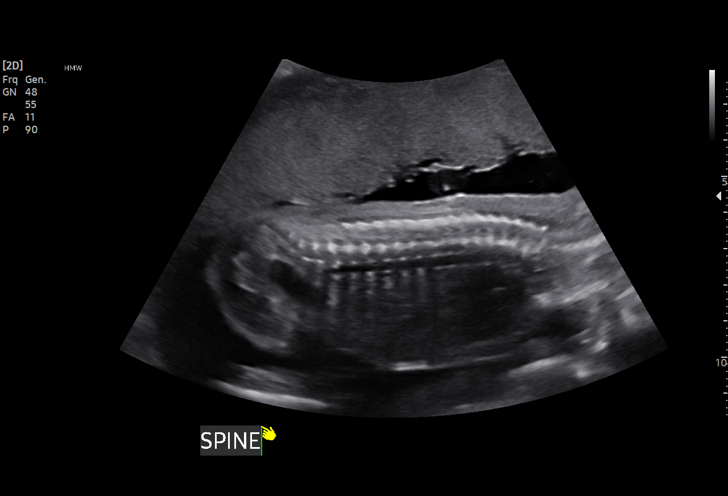
[im 24/92]
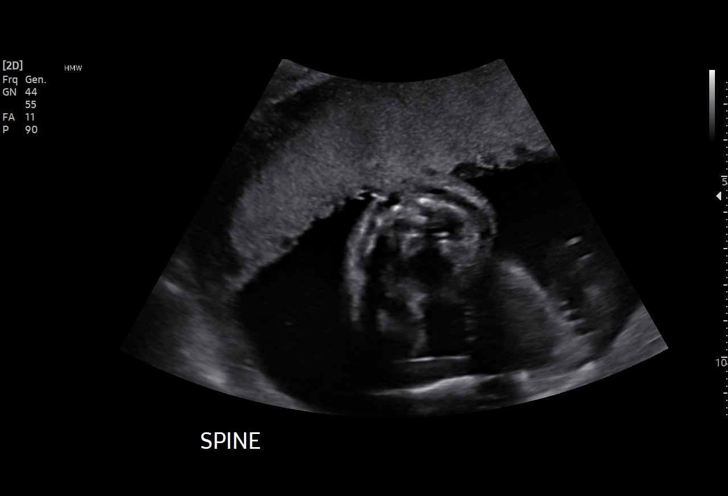
[im 31/92]
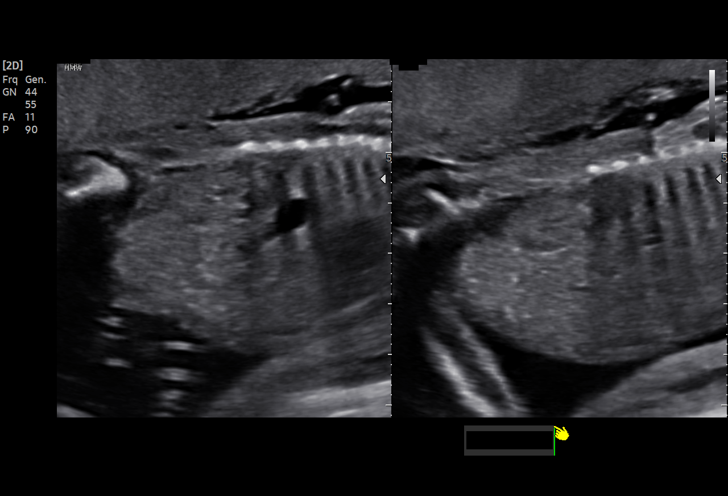
[im 38/92]
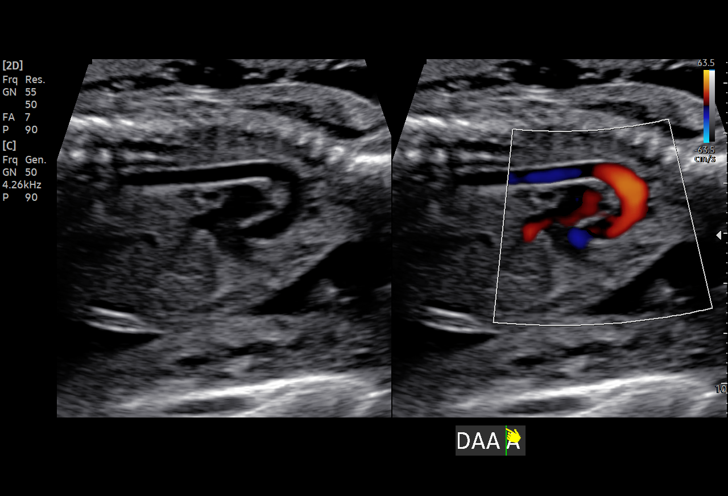
[im 48/92]
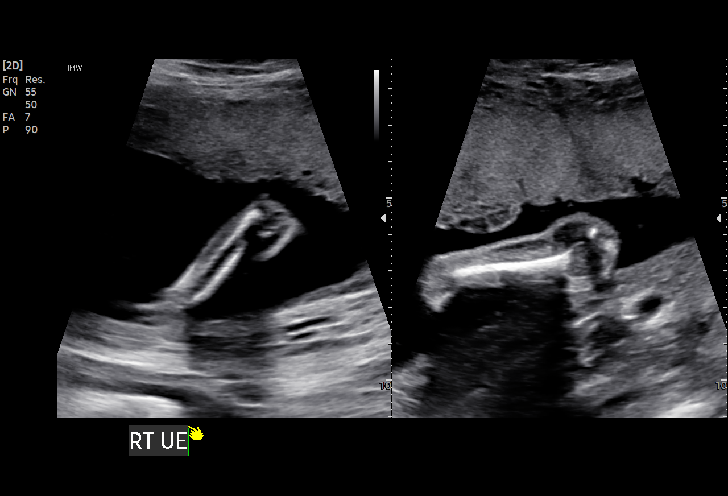
[im 54/92]
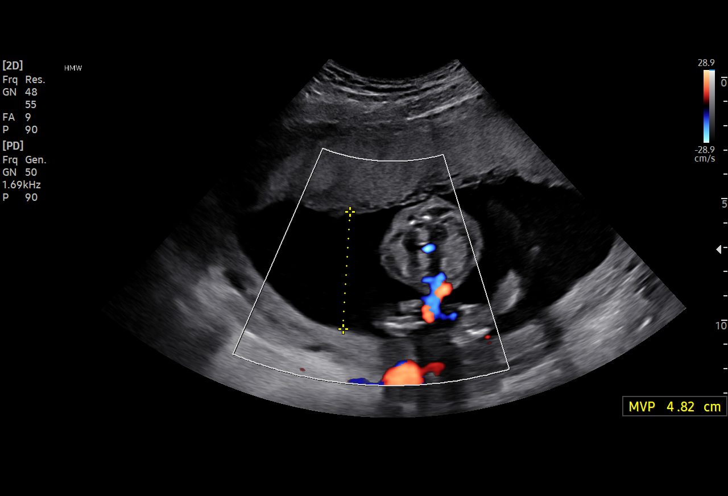
[im 61/92]
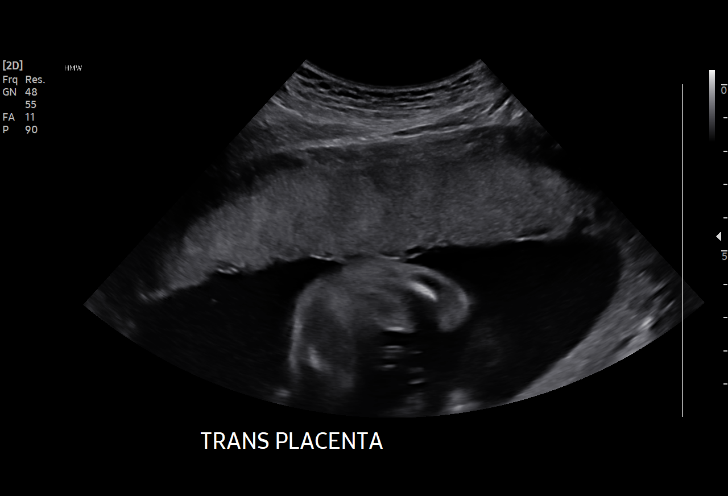
[im 68/92]
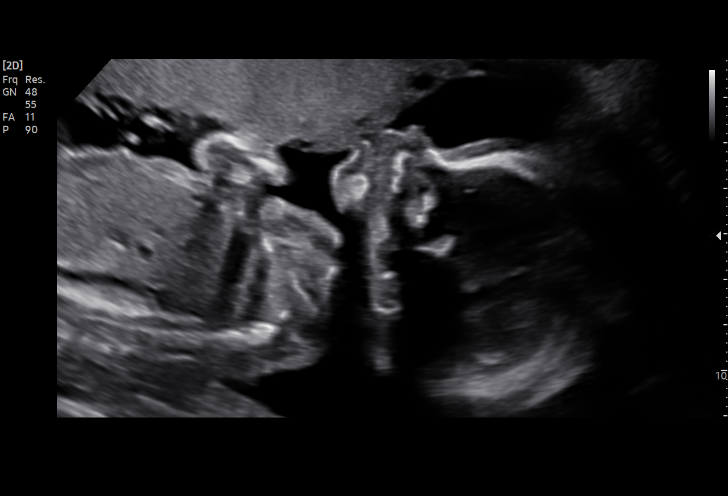
[im 75/92]
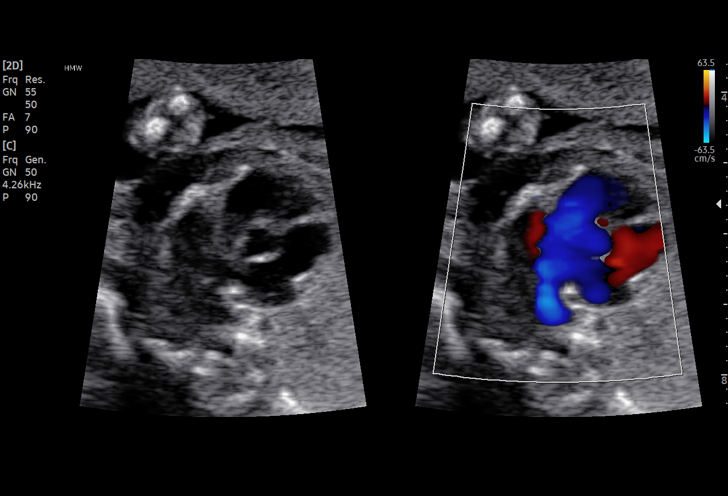
[im 81/92]
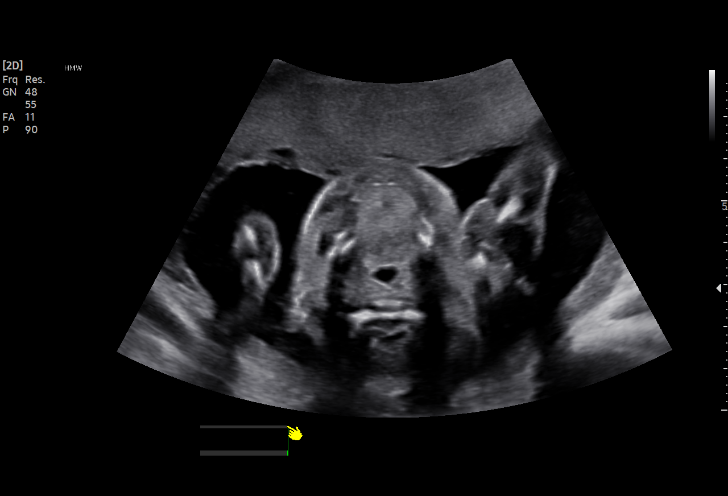
[im 88/92]
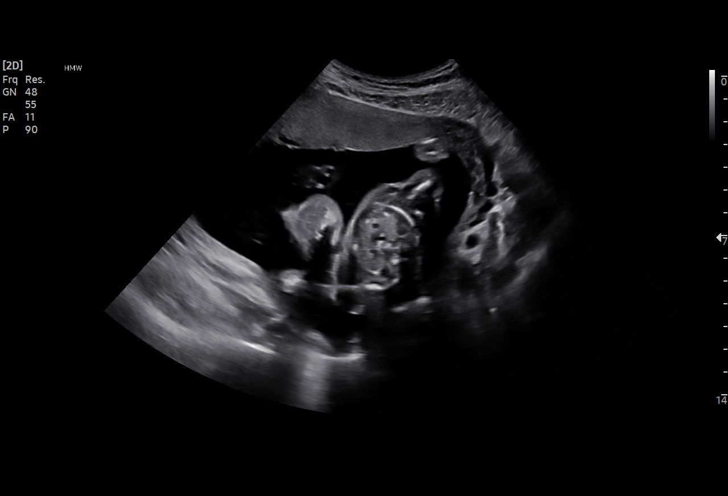

[13 of 28 positions shown; findings below may reference images not displayed]

Indications

 20 weeks gestation of pregnancy
 Encounter for uncertain dates
 Encounter for antenatal screening for
 malformations
 Declined genetic testing
 Insufficient Prenatal Care
Fetal Evaluation

 Num Of Fetuses:         1
 Cardiac Activity:       Observed
 Presentation:           Cephalic
 Placenta:               Anterior
 P. Cord Insertion:      Visualized, central

 Amniotic Fluid
 AFI FV:      Within normal limits

                             Largest Pocket(cm)

Biometry

 BPD:      50.5  mm     G. Age:  21w 2d         67  %    CI:        78.49   %    70 - 86
                                                         FL/HC:      18.7   %    15.9 -
 HC:      180.3  mm     G. Age:  20w 3d         24  %    HC/AC:      1.12        1.06 -
 AC:      161.1  mm     G. Age:  21w 1d         55  %    FL/BPD:     66.7   %
 FL:       33.7  mm     G. Age:  20w 4d         31  %    FL/AC:      20.9   %    20 - 24
 HUM:      33.2  mm     G. Age:  21w 1d         57  %
 CER:      21.5  mm     G. Age:  20w 2d         45  %
 CM:        7.8  mm

 Est. FW:     384  gm    0 lb 14 oz      46  %
OB History

 Gravidity:    2         Term:   0        Prem:   0        SAB:   1
 TOP:          0       Ectopic:  0        Living: 0
Gestational Age

 LMP:           23w 3d        Date:  07/12/19                 EDD:   04/17/20
 U/S Today:     20w 6d                                        EDD:   05/05/20
 Best:          20w 6d     Det. By:  U/S (12/23/19)           EDD:   05/05/20
Anatomy

 Cranium:               Appears normal         LVOT:                   Appears normal
 Cavum:                 Appears normal         Aortic Arch:            Appears normal
 Ventricles:            Appears normal         Ductal Arch:            Appears normal
 Choroid Plexus:        Appears normal         Diaphragm:              Appears normal
 Cerebellum:            Appears normal         Stomach:                Appears normal, left
                                                                       sided
 Posterior Fossa:       Appears normal         Abdomen:                Appears normal
 Nuchal Fold:           Not applicable (>20    Abdominal Wall:         Appears nml (cord
                        wks GA)                                        insert, abd wall)
 Face:                  Appears normal         Cord Vessels:           Appears normal (3
                        (orbits and profile)                           vessel cord)
 Lips:                  Not well visualized    Kidneys:                Appear normal
 Palate:                Appears normal         Bladder:                Appears normal
 Thoracic:              Appears normal         Spine:                  Appears normal
 Heart:                 Appears normal         Upper Extremities:      Appears normal
                        (4CH, axis, and
                        situs)
 RVOT:                  Appears normal         Lower Extremities:      Appears normal

 Other:  Fetus appears to be a male. Heels visualized. Technically difficult due
         to fetal position.
Cervix Uterus Adnexa

 Cervix
 Length:            3.4  cm.
 Normal appearance by transabdominal scan.

 Uterus
 No abnormality visualized.

 Right Ovary
 No adnexal mass visualized.

 Left Ovary
 No adnexal mass visualized.

 Cul De Sac
 No free fluid seen.

 Adnexa
 No abnormality visualized.
Impression

 Normal fetal anatomy with dates inconsistent with today's
 measurements. Therefore we have assigned the pregnancy
 to today's examination.
 Good fetal movement and anatomy was observed,
 suboptimal views of the fetal anatomy was obtained
 secondary to fetal position.
 Ms. Burks declined genetic testing.
Recommendations

 Follow upgrowth scheduled in 5 weeks.

## 2021-07-22 ENCOUNTER — Other Ambulatory Visit (HOSPITAL_BASED_OUTPATIENT_CLINIC_OR_DEPARTMENT_OTHER): Payer: Self-pay
